# Patient Record
Sex: Male | Born: 1960 | Race: White | Hispanic: No | Marital: Married | Smoking: Never smoker
Health system: Southern US, Community
[De-identification: ages and names within clinical notes are randomized; demographics above are authoritative.]

## PROBLEM LIST (undated history)

## (undated) DIAGNOSIS — E538 Deficiency of other specified B group vitamins: Secondary | ICD-10-CM

## (undated) DIAGNOSIS — R209 Unspecified disturbances of skin sensation: Secondary | ICD-10-CM

## (undated) DIAGNOSIS — R001 Bradycardia, unspecified: Secondary | ICD-10-CM

## (undated) DIAGNOSIS — R55 Syncope and collapse: Secondary | ICD-10-CM

## (undated) DIAGNOSIS — H269 Unspecified cataract: Secondary | ICD-10-CM

## (undated) DIAGNOSIS — N529 Male erectile dysfunction, unspecified: Secondary | ICD-10-CM

## (undated) DIAGNOSIS — C801 Malignant (primary) neoplasm, unspecified: Secondary | ICD-10-CM

## (undated) DIAGNOSIS — L570 Actinic keratosis: Secondary | ICD-10-CM

## (undated) DIAGNOSIS — D369 Benign neoplasm, unspecified site: Secondary | ICD-10-CM

## (undated) HISTORY — DX: Male erectile dysfunction, unspecified: N52.9

## (undated) HISTORY — DX: Unspecified disturbances of skin sensation: R20.9

## (undated) HISTORY — DX: Unspecified cataract: H26.9

## (undated) HISTORY — DX: Deficiency of other specified B group vitamins: E53.8

## (undated) HISTORY — PX: NECK SURGERY: SHX720

## (undated) HISTORY — PX: POLYPECTOMY: SHX149

## (undated) HISTORY — DX: Actinic keratosis: L57.0

## (undated) HISTORY — DX: Malignant (primary) neoplasm, unspecified: C80.1

## (undated) HISTORY — PX: COLONOSCOPY: SHX174

## (undated) HISTORY — DX: Syncope and collapse: R55

## (undated) HISTORY — DX: Bradycardia, unspecified: R00.1

## (undated) HISTORY — DX: Benign neoplasm, unspecified site: D36.9

---

## 1999-04-02 HISTORY — PX: ROTATOR CUFF REPAIR: SHX139

## 2003-03-24 ENCOUNTER — Ambulatory Visit (HOSPITAL_COMMUNITY): Admission: RE | Admit: 2003-03-24 | Discharge: 2003-03-24 | Payer: Self-pay | Admitting: Orthopedic Surgery

## 2003-03-24 ENCOUNTER — Ambulatory Visit (HOSPITAL_BASED_OUTPATIENT_CLINIC_OR_DEPARTMENT_OTHER): Admission: RE | Admit: 2003-03-24 | Discharge: 2003-03-24 | Payer: Self-pay | Admitting: Orthopedic Surgery

## 2009-04-19 ENCOUNTER — Ambulatory Visit: Payer: Self-pay | Admitting: Family Medicine

## 2009-04-19 DIAGNOSIS — N529 Male erectile dysfunction, unspecified: Secondary | ICD-10-CM | POA: Insufficient documentation

## 2009-04-19 DIAGNOSIS — L570 Actinic keratosis: Secondary | ICD-10-CM | POA: Insufficient documentation

## 2009-04-19 HISTORY — DX: Actinic keratosis: L57.0

## 2009-04-19 HISTORY — DX: Male erectile dysfunction, unspecified: N52.9

## 2009-04-21 LAB — CONVERTED CEMR LAB
ALT: 31 units/L (ref 0–53)
AST: 29 units/L (ref 0–37)
Alkaline Phosphatase: 79 units/L (ref 39–117)
Basophils Relative: 0 % (ref 0.0–3.0)
Bilirubin, Direct: 0.1 mg/dL (ref 0.0–0.3)
Calcium: 9.5 mg/dL (ref 8.4–10.5)
Cholesterol: 210 mg/dL — ABNORMAL HIGH (ref 0–200)
Creatinine, Ser: 1 mg/dL (ref 0.4–1.5)
Eosinophils Relative: 3.1 % (ref 0.0–5.0)
Hemoglobin: 14.7 g/dL (ref 13.0–17.0)
Lymphocytes Relative: 25.4 % (ref 12.0–46.0)
Monocytes Relative: 6.6 % (ref 3.0–12.0)
Neutro Abs: 4.4 10*3/uL (ref 1.4–7.7)
Neutrophils Relative %: 64.9 % (ref 43.0–77.0)
RBC: 4.98 M/uL (ref 4.22–5.81)
Sodium: 141 meq/L (ref 135–145)
Total CHOL/HDL Ratio: 4
Total Protein: 8 g/dL (ref 6.0–8.3)
Triglycerides: 62 mg/dL (ref 0.0–149.0)
VLDL: 12.4 mg/dL (ref 0.0–40.0)
WBC: 6.7 10*3/uL (ref 4.5–10.5)

## 2009-09-08 ENCOUNTER — Ambulatory Visit: Payer: Self-pay | Admitting: Family Medicine

## 2009-09-08 DIAGNOSIS — R209 Unspecified disturbances of skin sensation: Secondary | ICD-10-CM

## 2009-09-08 HISTORY — DX: Unspecified disturbances of skin sensation: R20.9

## 2010-05-01 NOTE — Assessment & Plan Note (Signed)
Summary: periodic numbness and tingling all over body/cjr   Vital Signs:  Patient profile:   50 year old male Weight:      206 pounds Temp:     98.6 degrees F oral BP sitting:   108 / 68  (left arm) Cuff size:   regular  Vitals Entered By: Sid Falcon LPN (September 08, 2009 2:38 PM) CC: abdominal pain Comments CBG 108   History of Present Illness: patient seen with episode 2 days ago after getting up from the bathroom of tingling over his body. Upper and lower extremities right and left side. Symptoms last about 5 minutes. No hyperventilation. Denies any confusion, headaches, chest pain, or syncope. No focal weakness. No speech changes. No facial asymmetry. No visual changes.  No recurrent symptoms since then.  No prior episode of similar symptoms.  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    Physical Exam  General:  Well-developed,well-nourished,in no acute distress; alert,appropriate and cooperative throughout examination Head:  Normocephalic and atraumatic without obvious abnormalities. No apparent alopecia or balding. Eyes:  No corneal or conjunctival inflammation noted. EOMI. Perrla. Funduscopic exam benign,  without hemorrhages, exudates or papilledema. Vision grossly normal. Ears:  External ear exam shows no significant lesions or deformities.  Otoscopic examination reveals clear canals, tympanic membranes are intact bilaterally without bulging, retraction, inflammation or discharge. Hearing is grossly normal bilaterally. Mouth:  Oral mucosa and oropharynx without lesions or exudates.  Teeth in good repair. Neck:  No deformities, masses, or tenderness noted. Lungs:  Normal respiratory effort, chest expands symmetrically. Lungs are clear to auscultation, no crackles or wheezes. Heart:  Normal rate and regular rhythm. S1 and S2 normal without gallop, murmur, click, rub or other extra sounds. Extremities:  No clubbing, cyanosis, edema, or deformity noted with normal full range of motion of all joints.   Neurologic:  No cranial nerve deficits noted. Station and gait are normal. Plantar reflexes are down-going bilaterally. DTRs are symmetrical throughout. Sensory, motor and coordinative functions appear intact.   Impression & Recommendations:  Problem # 1:  DYSESTHESIA (ICD-782.0) Assessment New question vasovagal type reaction. Nonfocal neuro exam at this point. Recommend observation.  Pt instructed to be in touch if any recurrent symptoms.  Complete Medication List: 1)  Cialis 20 Mg Tabs (Tadalafil) .... One by mouth every other day as needed  Patient Instructions: 1)  follow up promptly if you notice any focal weakness, recurrent numbness or tingling, confusion, or any progressive headaches 2)  ? vasovagal reaction

## 2010-05-01 NOTE — Assessment & Plan Note (Signed)
Summary: new to est-requesting cpx-will fast-ccm   Vital Signs:  Patient profile:   50 year old male Height:      72 inches Weight:      198 pounds BMI:     26.95 Temp:     98.2 degrees F oral Pulse rate:   80 / minute Pulse rhythm:   regular Resp:     12 per minute BP sitting:   120 / 92  (left arm) Cuff size:   regular  Vitals Entered By: Sid Falcon LPN (04-29-09 9:04 AM)  Nutrition Counseling: Patient's BMI is greater than 25 and therefore counseled on weight management options. CC: New to establish, fasting for labs   History of Present Illness: Patient seen to establish care and for wellness visit.  Past medical history reviewed. No chronic problems. Remote history right rotator cuff surgery several years ago. No medications. No known allergies. Family history significant for father dying of some sort of cardiomyopathy at 34. No known ischemic disease.  Patient currently undergoing divorce. Increased stress with job and 50 year old with learning disability. Nonsmoker. Rare alcohol use. Currently not exercising any. Last tetanus less than 5 years ago.  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 Alcohol use-yes Regular exercise-yes Son learning disability  Risk Factors: Exercise: yes (04-29-2009)  Risk Factors: Smoking Status: never (04/29/2009)  Past Medical History: No chronic medical problem  Past Surgical History: R rotator cuff 2001  Family History: Heart disease, father dies age 21  Cardiomyopathy, nonischemic  Social History: Occupation:   Public librarian Divorced Never Smoked Alcohol use-yes Regular exercise-yes Son learning disability Occupation:  employed Smoking Status:  never Does Patient Exercise:  yes  Review of Systems  The patient denies anorexia, fever, weight loss, weight gain, vision loss, decreased hearing, hoarseness, chest pain, syncope, dyspnea on exertion, peripheral edema, prolonged cough, headaches, hemoptysis, abdominal pain, melena, hematochezia, severe indigestion/heartburn, hematuria, incontinence, genital sores, muscle weakness, suspicious skin lesions, transient blindness, difficulty walking, depression, unusual weight change, abnormal bleeding, enlarged lymph nodes, and testicular masses.         scaly skin lesion nonpainful left ear.  occasional problems with erectile dysfunction. Occasional problems with sleep.  Physical Exam  General:  Well-developed,well-nourished,in no acute distress; alert,appropriate and cooperative throughout examination Head:  Normocephalic and atraumatic without obvious abnormalities. No apparent alopecia or balding. Eyes:  No corneal or conjunctival inflammation noted. EOMI. Perrla. Funduscopic exam benign, without hemorrhages, exudates or papilledema. Vision grossly normal. Ears:  External ear exam shows no significant lesions or deformities.  Otoscopic examination reveals clear canals, tympanic membranes are intact bilaterally without bulging, retraction, inflammation or discharge. Hearing is grossly normal bilaterally. Nose:  External nasal examination shows no deformity or inflammation. Nasal mucosa are pink and moist without lesions or exudates. Mouth:  Oral mucosa and oropharynx without lesions or exudates.  Teeth in good repair. Neck:  No deformities, masses, or tenderness noted. Chest Wall:  No deformities, masses, tenderness or gynecomastia noted. Lungs:  Normal respiratory effort, chest expands symmetrically. Lungs are clear to auscultation, no crackles or  wheezes. Heart:  Normal rate and regular rhythm. S1 and S2 normal without gallop, murmur, click, rub or other extra sounds.  Abdomen:  Bowel sounds positive,abdomen soft and non-tender without masses, organomegaly or hernias noted. Extremities:  No clubbing, cyanosis, edema, or deformity noted with normal full range of motion of all joints.   Neurologic:  alert & oriented X3, cranial nerves II-XII intact, strength normal in all extremities, and gait normal.   Skin:  left outer ear reveals small area approximately 2 x 3 mm slightly scaly with slightly erythematous base. No nodular changes. No ulcerative changes. Cervical Nodes:  No lymphadenopathy noted Psych:  Cognition and judgment appear intact. Alert and cooperative with normal attention span and concentration. No apparent delusions, illusions, hallucinations   Impression & Recommendations:  Problem # 1:  Preventive Health Care (ICD-V70.0) Screening labs.  discussed starting regular exercise and other measures for stress reduction.  Problem # 2:  ACTINIC KERATOSIS (ICD-702.0) discussed risks and benefits of cryotherapy and pt wished to proceed.  Treated without difficulty.  Pt instructed to have this reassessed if not improving/resolving in one month. Orders: Cryotherapy/Destruction benign or premalignant lesion (1st lesion)  (17000)  Problem # 3:  IMPOTENCE OF ORGANIC ORIGIN (ICD-607.84) Rx for Cialis which he has taken in past. His updated medication list for this problem includes:    Cialis 20 Mg Tabs (Tadalafil) ..... One by mouth every other day as needed  Complete Medication List: 1)  Cialis 20 Mg Tabs (Tadalafil) .... One by mouth every other day as needed  Other Orders: Venipuncture (10272) TLB-Lipid Panel (80061-LIPID) TLB-BMP (Basic Metabolic Panel-BMET) (80048-METABOL) TLB-CBC Platelet - w/Differential (85025-CBCD) TLB-Hepatic/Liver Function Pnl (80076-HEPATIC) TLB-TSH (Thyroid Stimulating Hormone)  (84443-TSH)  Patient Instructions: 1)  It is important that you exercise reguarly at least 20 minutes 5 times a week. If you develop chest pain, have severe difficulty breathing, or feel very tired, stop exercising immediately and seek medical attention.  Prescriptions: CIALIS 20 MG TABS (TADALAFIL) one by mouth every other day as needed  #6 x 11   Entered and Authorized by:   Evelena Peat MD   Signed by:   Evelena Peat MD on 04/19/2009   Method used:   Electronically to        [facility]  [phone]* (retail)       [address] [place], [place]  [address]       Ph: [phone] or [phone]       Fax: [phone]   RxID:   [id number]

## 2010-05-21 ENCOUNTER — Encounter: Payer: Self-pay | Admitting: Family Medicine

## 2010-05-21 ENCOUNTER — Ambulatory Visit (INDEPENDENT_AMBULATORY_CARE_PROVIDER_SITE_OTHER)
Admission: RE | Admit: 2010-05-21 | Discharge: 2010-05-21 | Disposition: A | Discharge status: Home / Self Care | Payer: PRIVATE HEALTH INSURANCE | Source: Ambulatory Visit | Attending: Family Medicine | Admitting: Family Medicine

## 2010-05-21 ENCOUNTER — Ambulatory Visit (INDEPENDENT_AMBULATORY_CARE_PROVIDER_SITE_OTHER): Payer: PRIVATE HEALTH INSURANCE | Admitting: Family Medicine

## 2010-05-21 VITALS — BP 110/84 | Temp 98.1°F | Ht 72.5 in | Wt 198.0 lb

## 2010-05-21 DIAGNOSIS — R109 Unspecified abdominal pain: Secondary | ICD-10-CM

## 2010-05-21 LAB — POCT URINALYSIS DIPSTICK
Bilirubin, UA: NEGATIVE
Blood, UA: NEGATIVE
Ketones, UA: NEGATIVE
Nitrite, UA: NEGATIVE
Spec Grav, UA: 1.025
pH, UA: 5

## 2010-05-21 MED ORDER — HYDROCODONE-ACETAMINOPHEN 5-325 MG PO TABS: 2.0000 | ORAL_TABLET | Freq: Four times a day (QID) | ORAL | Status: AC | PRN

## 2010-05-21 NOTE — Progress Notes (Signed)
  Subjective:    Patient ID: Paul Bird, male    DOB: 01/31/1961, 50 y.o.   MRN: [id number]  HPI  Patient is seen with one week history of progressive pain right flank area. States sudden onset. Progressive in intensity and currently about 9/10 in severity. Sharp at times. Occasional slight radiation anteriorly. No history of kidney stones. He denies any dysuria, fever, chills, gross hematuria. No exacerbating features. Took some Aleve without any relief. No real abdominal pain. No skin rashes.  No history of injury.   Review of Systems  Constitutional: Negative for fever, chills, diaphoresis, activity change, appetite change and unexpected weight change.  Respiratory: Negative for cough and shortness of breath.   Cardiovascular: Negative for chest pain, palpitations and leg swelling.  Gastrointestinal: Negative for nausea, vomiting, abdominal pain, diarrhea, constipation, blood in stool and abdominal distention.  Genitourinary: Positive for flank pain. Negative for dysuria, frequency, hematuria, decreased urine volume, enuresis, difficulty urinating and testicular pain.  Musculoskeletal: Positive for back pain. Negative for myalgias and joint swelling.  Skin: Negative for rash.       Objective:   Physical Exam  patient is alert and in no acute distress.  neck no masses Chest clear to auscultation Heart regular rhythm and rate with no murmur Abdomen soft nontender with no masses palpated Back exam no visible rash. No ecchymosis or any visible swelling or warmth. Tender right flank region  Skin exam no rash  Extremities no edema       Assessment & Plan:   right flank pain. Rule out kidney stone versus musculoskeletal. Urine dipstick reveals no hematuria which makes stones somewhat less likely Check KUB scout.  Consider CT abd/pelvis if pain persists or worsens.  Hydrocodone for pain relief.

## 2010-08-17 NOTE — Op Note (Signed)
Paul Bird, Paul Bird                           ACCOUNT NO.:  [id number]   MEDICAL RECORD NO.:  [id number]                   PATIENT TYPE:  AMB   LOCATION:  [facility]                                  FACILITY:  [facility]   PHYSICIAN:  Loreta Ave, M.D.              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 Labral tear.   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 Labral tear.   PROCEDURE:  Right shoulder exam under anesthesia, arthroscopy, debridement  of labrum and rotator cuff. Acromioplasty with CA ligament release. Excision  of distal clavicle.   SURGEON:  Loreta Ave, M.D.   ASSISTANT:  Arlys John D. Petrarca, P.A.-C.   ANESTHESIA:  General.   ESTIMATED BLOOD LOSS:  Minimal.   SPECIMENS:  None.   CULTURES:  None.   COMPLICATIONS:  None.   DRESSING:  Soft compressive with sling.   DESCRIPTION OF PROCEDURE:  The patient was brought to the operating room and  placed on the operating table in the supine position. After adequate  anesthesia had been obtained the right shoulder was examined. It had full  motion and good stability. The patient was placed in the beachchair position  on a shoulder positioner and prepped and draped in the usual sterile  fashion.   Three standard portals, anterior and posterolateral. The shoulder was  entered with a blunt obturator and distended and inspected. The glenohumeral  joint had intact cartilage. Circumferential complex tearing, labrum all  debrided to a stable surface. The biceps tendon was intact. A little  mobility at the anchor, but still intact. The undersurface of the cuff had  some roughening but no significant structural tears. The  capsule and  ligamentous structures were intact.   The cannula was redirected subacromially. Markedly thickened bursa with  abrasive change  over the top of the cuff with a type 2 acromion and marked  spurring at the acromioclavicular joint. The bursa was resected, the cuff  debrided and acromioplasty to a type 1 acromion with a shaver and a high-  speed bur, releasing the CA ligament with cautery.   Distal clavicle with grade 4 changes with marked osteolysis and spurs. All  spurs, loose fragments and remnants of cartilage removed. Resection of a  lateral centimeter of clavicle with shaver and a high-speed bur. Adequacy of  decompression and clavicular excision confirmed viewing from all portals.   The instruments and fluid were removed. The portals, shoulder and bursa were  injected with Marcaine. The portal was closed with 4-0 nylon. A sterile  compressive dressing  was applied. Anesthesia was reversed.   The patient was brought to the recovery room. He  tolerated  the surgery well  without complications.                                               Loreta Ave, M.D.    DFM/MEDQ  D:  03/24/2003  T:  03/26/2003  Job:  [id number]

## 2011-03-22 ENCOUNTER — Other Ambulatory Visit: Payer: Self-pay | Admitting: *Deleted

## 2011-03-22 MED ORDER — TADALAFIL 20 MG PO TABS: 10.0000 mg | ORAL_TABLET | ORAL | Status: DC | PRN

## 2011-07-30 ENCOUNTER — Other Ambulatory Visit: Payer: Self-pay | Admitting: *Deleted

## 2011-07-30 MED ORDER — TADALAFIL 20 MG PO TABS: 10.0000 mg | ORAL_TABLET | ORAL | Status: DC | PRN

## 2011-08-06 ENCOUNTER — Ambulatory Visit: Payer: PRIVATE HEALTH INSURANCE | Admitting: Family Medicine

## 2011-08-06 ENCOUNTER — Other Ambulatory Visit (INDEPENDENT_AMBULATORY_CARE_PROVIDER_SITE_OTHER): Payer: 59

## 2011-08-06 DIAGNOSIS — Z0000 Encounter for general adult medical examination without abnormal findings: Secondary | ICD-10-CM

## 2011-08-06 LAB — CBC WITH DIFFERENTIAL/PLATELET
Basophils Absolute: 0 10*3/uL (ref 0.0–0.1)
Eosinophils Relative: 2.4 % (ref 0.0–5.0)
Hemoglobin: 14.6 g/dL (ref 13.0–17.0)
Lymphocytes Relative: 24.3 % (ref 12.0–46.0)
Lymphs Abs: 2 10*3/uL (ref 0.7–4.0)
MCHC: 33.4 g/dL (ref 30.0–36.0)
MCV: 90.5 fl (ref 78.0–100.0)
Monocytes Relative: 7.8 % (ref 3.0–12.0)
Neutrophils Relative %: 65.1 % (ref 43.0–77.0)
RBC: 4.82 Mil/uL (ref 4.22–5.81)

## 2011-08-06 LAB — BASIC METABOLIC PANEL
CO2: 28 mEq/L (ref 19–32)
Chloride: 106 mEq/L (ref 96–112)
Creatinine, Ser: 1.1 mg/dL (ref 0.4–1.5)
Glucose, Bld: 80 mg/dL (ref 70–99)

## 2011-08-06 LAB — HEPATIC FUNCTION PANEL
ALT: 24 U/L (ref 0–53)
AST: 30 U/L (ref 0–37)
Alkaline Phosphatase: 73 U/L (ref 39–117)
Bilirubin, Direct: 0.1 mg/dL (ref 0.0–0.3)
Total Bilirubin: 0.7 mg/dL (ref 0.3–1.2)

## 2011-08-06 LAB — POCT URINALYSIS DIPSTICK
Glucose, UA: NEGATIVE
Leukocytes, UA: NEGATIVE
Urobilinogen, UA: 0.2

## 2011-08-06 LAB — LIPID PANEL: Cholesterol: 185 mg/dL (ref 0–200)

## 2011-08-12 ENCOUNTER — Encounter: Payer: Self-pay | Admitting: Family Medicine

## 2011-08-12 ENCOUNTER — Ambulatory Visit (INDEPENDENT_AMBULATORY_CARE_PROVIDER_SITE_OTHER): Payer: 59 | Admitting: Family Medicine

## 2011-08-12 VITALS — BP 142/82 | HR 80 | Temp 97.9°F | Resp 12 | Ht 72.5 in | Wt 200.0 lb

## 2011-08-12 DIAGNOSIS — N400 Enlarged prostate without lower urinary tract symptoms: Secondary | ICD-10-CM

## 2011-08-12 DIAGNOSIS — Z0000 Encounter for general adult medical examination without abnormal findings: Secondary | ICD-10-CM

## 2011-08-12 MED ORDER — TADALAFIL 20 MG PO TABS: 20.0000 mg | ORAL_TABLET | ORAL | Status: DC | PRN

## 2011-08-12 NOTE — Progress Notes (Signed)
  Subjective:    Patient ID: Paul Bird, male    DOB: January 08, 1961, 51 y.o.   MRN: [id number]  HPI  Complete physical. Patient is generally very healthy. Takes only Cialis as needed for erectile dysfunction but no other medications. Exercises regularly several days per week. Nonsmoker.  Turned 51 this year. No history of screening colonoscopy. Had tetanus last October.  Family history, past medical history, and social history reviewed as indicated below:  Past Medical History  Diagnosis Date  . Erectile dysfunction   . Actinic keratosis 04/19/2009    Qualifier: Diagnosis of  By: Rita Ohara    . 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 DYSESTHESIA 09/08/2009    Qualifier: Diagnosis of  By: Caryl Never MD, Oliviya Gilkison     Past Surgical History  Procedure Date  . Rotator cuff repair     reports that he has never smoked. He does not have any smokeless tobacco history on file. His alcohol and drug histories not on file. family history includes Heart disease (age of onset:57) in his father. No Known Allergies    Review of Systems  Constitutional: Negative for fever, activity change, appetite change and fatigue.  HENT: Negative for ear pain, congestion and trouble swallowing.   Eyes: Negative for pain and visual disturbance.  Respiratory: Negative for cough, shortness of breath and wheezing.   Cardiovascular: Negative for chest pain and palpitations.  Gastrointestinal: Negative for nausea, vomiting, abdominal pain, diarrhea, constipation, blood in stool, abdominal distention and rectal pain.  Genitourinary: Negative for dysuria, hematuria and testicular pain.  Musculoskeletal: Negative for joint swelling and arthralgias.  Skin: Negative for rash.  Neurological: Negative for dizziness, syncope and headaches.  Hematological: Negative for adenopathy.  Psychiatric/Behavioral: Negative for confusion and dysphoric mood.       Objective:     Physical Exam  Constitutional: He is oriented to person, place, and time. He appears well-developed and well-nourished. No distress.  HENT:  Head: Normocephalic and atraumatic.  Right Ear: External ear normal.  Left Ear: External ear normal.  Mouth/Throat: Oropharynx is clear and moist.  Eyes: Conjunctivae and EOM are normal. Pupils are equal, round, and reactive to light.  Neck: Normal range of motion. Neck supple. No thyromegaly present.  Cardiovascular: Normal rate, regular rhythm and normal heart sounds.   No murmur heard. Pulmonary/Chest: No respiratory distress. He has no wheezes. He has no rales.  Abdominal: Soft. Bowel sounds are normal. He exhibits no distension and no mass. There is no tenderness. There is no rebound and no guarding.  Genitourinary: Rectum normal and prostate normal.  Musculoskeletal: He exhibits no edema.  Lymphadenopathy:    He has no cervical adenopathy.  Neurological: He is alert and oriented to person, place, and time. He displays normal reflexes. No cranial nerve deficit.  Skin: No rash noted.  Psychiatric: He has a normal mood and affect. His behavior is normal.          Assessment & Plan:  Complete physical. Labs reviewed with patient. All basically favorable with the exception of high normal PSA for age. No asymmetry with prostate exam. Repeat PSA in 6 months. Schedule screening colonoscopy. Refill Cialis for one year

## 2011-08-12 NOTE — Patient Instructions (Signed)
We call you regarding colonoscopy screening

## 2011-10-18 ENCOUNTER — Encounter: Payer: Self-pay | Admitting: Gastroenterology

## 2012-01-31 ENCOUNTER — Other Ambulatory Visit: Payer: 59

## 2012-02-10 ENCOUNTER — Other Ambulatory Visit: Payer: 59

## 2012-02-12 ENCOUNTER — Other Ambulatory Visit: Payer: 59

## 2012-04-09 ENCOUNTER — Other Ambulatory Visit (INDEPENDENT_AMBULATORY_CARE_PROVIDER_SITE_OTHER): Payer: Commercial Managed Care - PPO

## 2012-04-09 DIAGNOSIS — N400 Enlarged prostate without lower urinary tract symptoms: Secondary | ICD-10-CM

## 2012-04-09 DIAGNOSIS — Z0000 Encounter for general adult medical examination without abnormal findings: Secondary | ICD-10-CM

## 2012-04-09 LAB — BASIC METABOLIC PANEL
CO2: 31 mEq/L (ref 19–32)
Glucose, Bld: 95 mg/dL (ref 70–99)
Potassium: 4 mEq/L (ref 3.5–5.1)
Sodium: 137 mEq/L (ref 135–145)

## 2012-04-09 LAB — CBC WITH DIFFERENTIAL/PLATELET
Basophils Relative: 0.5 % (ref 0.0–3.0)
Eosinophils Relative: 4 % (ref 0.0–5.0)
HCT: 41.9 % (ref 39.0–52.0)
Hemoglobin: 14.2 g/dL (ref 13.0–17.0)
Lymphocytes Relative: 27.9 % (ref 12.0–46.0)
Monocytes Absolute: 0.6 10*3/uL (ref 0.1–1.0)
Monocytes Relative: 8.2 % (ref 3.0–12.0)
Neutrophils Relative %: 59.4 % (ref 43.0–77.0)
Platelets: 214 10*3/uL (ref 150.0–400.0)

## 2012-04-09 LAB — LIPID PANEL
LDL Cholesterol: 123 mg/dL — ABNORMAL HIGH (ref 0–99)
Total CHOL/HDL Ratio: 4
Triglycerides: 68 mg/dL (ref 0.0–149.0)

## 2012-04-09 LAB — HEPATIC FUNCTION PANEL
AST: 21 U/L (ref 0–37)
Albumin: 3.9 g/dL (ref 3.5–5.2)
Alkaline Phosphatase: 62 U/L (ref 39–117)
Total Protein: 7.4 g/dL (ref 6.0–8.3)

## 2012-04-09 LAB — POCT URINALYSIS DIPSTICK
Blood, UA: NEGATIVE
Glucose, UA: NEGATIVE
Spec Grav, UA: 1.025
Urobilinogen, UA: 0.2

## 2012-04-16 ENCOUNTER — Encounter: Payer: Self-pay | Admitting: Family Medicine

## 2012-04-16 ENCOUNTER — Ambulatory Visit (INDEPENDENT_AMBULATORY_CARE_PROVIDER_SITE_OTHER): Payer: Commercial Managed Care - PPO | Admitting: Family Medicine

## 2012-04-16 VITALS — BP 110/78 | HR 72 | Temp 98.9°F | Resp 12 | Ht 72.75 in | Wt 198.0 lb

## 2012-04-16 DIAGNOSIS — Z0000 Encounter for general adult medical examination without abnormal findings: Secondary | ICD-10-CM

## 2012-04-16 MED ORDER — SILDENAFIL CITRATE 100 MG PO TABS: 100.0000 mg | ORAL_TABLET | Freq: Every day | ORAL | Status: DC | PRN

## 2012-04-16 NOTE — Progress Notes (Signed)
  Subjective:    Patient ID: Paul Bird, male    DOB: 1960-11-24, 52 y.o.   MRN: [id number]  HPI Patient here for complete physical. We set up colonoscopy last year been never went. He had problems with scheduling. Very healthy. History of erectile dysfunction. Has taken Cialis and requesting Viagra. No prior side effects. Tetanus up-to-date. Declines flu vaccine. Exercising regularly. No chest pains or dizziness. Nonsmoker  Father had some type of heart issue at age 54 but this sounds like more of a myocarditis. No history of CAD  Past Medical History  Diagnosis Date  . Erectile dysfunction   . Actinic keratosis 04/19/2009    Qualifier: Diagnosis of  By: Rita Ohara    . 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 DYSESTHESIA 09/08/2009    Qualifier: Diagnosis of  By: Caryl Never MD, Samyria Rudie     Past Surgical History  Procedure Date  . Rotator cuff repair     reports that he has never smoked. He does not have any smokeless tobacco history on file. His alcohol and drug histories not on file. family history includes Heart disease (age of onset:57) in his father. No Known Allergies    Review of Systems  Constitutional: Negative for fever, activity change, appetite change and fatigue.  HENT: Negative for ear pain, congestion and trouble swallowing.   Eyes: Negative for pain and visual disturbance.  Respiratory: Negative for cough, shortness of breath and wheezing.   Cardiovascular: Negative for chest pain and palpitations.  Gastrointestinal: Negative for nausea, vomiting, abdominal pain, diarrhea, constipation, blood in stool, abdominal distention and rectal pain.  Genitourinary: Negative for dysuria, hematuria and testicular pain.  Musculoskeletal: Negative for joint swelling and arthralgias.  Skin: Negative for rash.  Neurological: Negative for dizziness, syncope and headaches.  Hematological: Negative for adenopathy.    Psychiatric/Behavioral: Negative for confusion and dysphoric mood.       Objective:   Physical Exam  Constitutional: He is oriented to person, place, and time. He appears well-developed and well-nourished. No distress.  HENT:  Head: Normocephalic and atraumatic.  Right Ear: External ear normal.  Left Ear: External ear normal.  Mouth/Throat: Oropharynx is clear and moist.  Eyes: Conjunctivae normal and EOM are normal. Pupils are equal, round, and reactive to light.  Neck: Normal range of motion. Neck supple. No thyromegaly present.  Cardiovascular: Normal rate, regular rhythm and normal heart sounds.   No murmur heard. Pulmonary/Chest: No respiratory distress. He has no wheezes. He has no rales.  Abdominal: Soft. Bowel sounds are normal. He exhibits no distension and no mass. There is no tenderness. There is no rebound and no guarding.  Musculoskeletal: He exhibits no edema.  Lymphadenopathy:    He has no cervical adenopathy.  Neurological: He is alert and oriented to person, place, and time. He displays normal reflexes. No cranial nerve deficit.  Skin: No rash noted.  Psychiatric: He has a normal mood and affect.          Assessment & Plan:  Healthy 52 year old male. Labs reviewed. Reschedule colonoscopy. Viagra 100 mg one half to one tablet daily as needed

## 2013-04-30 ENCOUNTER — Other Ambulatory Visit: Payer: Self-pay | Admitting: Family Medicine

## 2013-10-04 ENCOUNTER — Other Ambulatory Visit (INDEPENDENT_AMBULATORY_CARE_PROVIDER_SITE_OTHER): Payer: Commercial Managed Care - PPO

## 2013-10-04 DIAGNOSIS — Z0000 Encounter for general adult medical examination without abnormal findings: Secondary | ICD-10-CM

## 2013-10-04 LAB — POCT URINALYSIS DIPSTICK
Bilirubin, UA: NEGATIVE
Blood, UA: NEGATIVE
Glucose, UA: NEGATIVE
Ketones, UA: NEGATIVE
Leukocytes, UA: NEGATIVE
Nitrite, UA: NEGATIVE
PH UA: 8.5
Spec Grav, UA: 1.015
Urobilinogen, UA: 1

## 2013-10-04 LAB — LIPID PANEL
Cholesterol: 218 mg/dL — ABNORMAL HIGH (ref 0–200)
HDL: 53.1 mg/dL (ref >39.00)
LDL CALC: 149 mg/dL — AB (ref 0–99)
NonHDL: 164.9
Total CHOL/HDL Ratio: 4
Triglycerides: 80 mg/dL (ref 0.0–149.0)
VLDL: 16 mg/dL (ref 0.0–40.0)

## 2013-10-04 LAB — CBC WITH DIFFERENTIAL/PLATELET
BASOS ABS: 0 10*3/uL (ref 0.0–0.1)
Basophils Relative: 0.4 % (ref 0.0–3.0)
EOS ABS: 0.4 10*3/uL (ref 0.0–0.7)
Eosinophils Relative: 5.3 % — ABNORMAL HIGH (ref 0.0–5.0)
HEMATOCRIT: 42.3 % (ref 39.0–52.0)
HEMOGLOBIN: 14.3 g/dL (ref 13.0–17.0)
LYMPHS ABS: 1.7 10*3/uL (ref 0.7–4.0)
Lymphocytes Relative: 22.9 % (ref 12.0–46.0)
MCHC: 33.7 g/dL (ref 30.0–36.0)
MCV: 88.6 fl (ref 78.0–100.0)
MONO ABS: 0.6 10*3/uL (ref 0.1–1.0)
Monocytes Relative: 7.6 % (ref 3.0–12.0)
NEUTROS ABS: 4.7 10*3/uL (ref 1.4–7.7)
Neutrophils Relative %: 63.8 % (ref 43.0–77.0)
PLATELETS: 240 10*3/uL (ref 150.0–400.0)
RBC: 4.78 Mil/uL (ref 4.22–5.81)
RDW: 13.3 % (ref 11.5–15.5)
WBC: 7.3 10*3/uL (ref 4.0–10.5)

## 2013-10-04 LAB — HEPATIC FUNCTION PANEL
ALBUMIN: 4.1 g/dL (ref 3.5–5.2)
ALT: 29 U/L (ref 0–53)
AST: 28 U/L (ref 0–37)
Alkaline Phosphatase: 74 U/L (ref 39–117)
BILIRUBIN TOTAL: 0.9 mg/dL (ref 0.2–1.2)
Bilirubin, Direct: 0.1 mg/dL (ref 0.0–0.3)
Total Protein: 7.8 g/dL (ref 6.0–8.3)

## 2013-10-04 LAB — BASIC METABOLIC PANEL
BUN: 14 mg/dL (ref 6–23)
CO2: 28 meq/L (ref 19–32)
Calcium: 9.4 mg/dL (ref 8.4–10.5)
Chloride: 101 mEq/L (ref 96–112)
Creatinine, Ser: 1.1 mg/dL (ref 0.4–1.5)
GFR: 76.94 mL/min (ref >60.00)
GLUCOSE: 94 mg/dL (ref 70–99)
POTASSIUM: 4.2 meq/L (ref 3.5–5.1)
SODIUM: 138 meq/L (ref 135–145)

## 2013-10-04 LAB — PSA: PSA: 1.82 ng/mL (ref 0.10–4.00)

## 2013-10-04 LAB — TSH: TSH: 0.78 u[IU]/mL (ref 0.35–4.50)

## 2013-10-12 ENCOUNTER — Encounter: Payer: Self-pay | Admitting: Family Medicine

## 2013-10-12 ENCOUNTER — Ambulatory Visit (INDEPENDENT_AMBULATORY_CARE_PROVIDER_SITE_OTHER): Payer: Commercial Managed Care - PPO | Admitting: Family Medicine

## 2013-10-12 VITALS — BP 126/82 | HR 85 | Temp 97.6°F | Ht 72.0 in | Wt 200.0 lb

## 2013-10-12 DIAGNOSIS — F909 Attention-deficit hyperactivity disorder, unspecified type: Secondary | ICD-10-CM

## 2013-10-12 DIAGNOSIS — Z0000 Encounter for general adult medical examination without abnormal findings: Secondary | ICD-10-CM

## 2013-10-12 DIAGNOSIS — N528 Other male erectile dysfunction: Secondary | ICD-10-CM

## 2013-10-12 DIAGNOSIS — N529 Male erectile dysfunction, unspecified: Secondary | ICD-10-CM

## 2013-10-12 DIAGNOSIS — B351 Tinea unguium: Secondary | ICD-10-CM

## 2013-10-12 DIAGNOSIS — F988 Other specified behavioral and emotional disorders with onset usually occurring in childhood and adolescence: Secondary | ICD-10-CM

## 2013-10-12 HISTORY — DX: Attention-deficit hyperactivity disorder, unspecified type: F90.9

## 2013-10-12 MED ORDER — TADALAFIL 20 MG PO TABS
10.0000 mg | ORAL_TABLET | ORAL | Status: DC | PRN
Start: 2013-10-12 — End: 2014-08-12

## 2013-10-12 MED ORDER — LISDEXAMFETAMINE DIMESYLATE 50 MG PO CAPS: 50.0000 mg | ORAL_CAPSULE | Freq: Every day | ORAL | Status: DC

## 2013-10-12 MED ORDER — TERBINAFINE HCL 250 MG PO TABS: 250.0000 mg | ORAL_TABLET | Freq: Every day | ORAL | Status: DC

## 2013-10-12 NOTE — Progress Notes (Signed)
Subjective:    Patient ID: Paul Bird, male    DOB: 02-06-1961, 53 y.o.   MRN: [id number]  HPI Patient seen for complete physical and several other issues as below. Generally very healthy. He does not take any regular medications. Tetanus up-to-date. He has not had screening colonoscopy. Exercises fairly regularly.  Erectile dysfunction. Previously used Viagra but had headaches. Would like to explore other options. Good libido.  Onychomycotic changes mostly right foot involving the great toenail and also third and fourth digits. He has some thick and brittle changes.  treated previously with some kind of over-the-counter topical which did not work  Concern for attention deficit disorder. He has a new job which requires lots of focus. He has easy distractibility and difficulty completing tasks. Difficulty getting organized. Frequently misplaces or loses things. Has previously been on Vyvanse which worked well for him. Other than mild insomnia no side effects  Past Medical History  Diagnosis Date  . Erectile dysfunction   . Actinic keratosis 04/19/2009    Qualifier: Diagnosis of  By: Joyce Gross    . 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 DYSESTHESIA 09/08/2009    Qualifier: Diagnosis of  By: Elease Hashimoto MD, Johnnie Moten     Past Surgical History  Procedure Laterality Date  . Rotator cuff repair      reports that he has never smoked. He does not have any smokeless tobacco history on file. His alcohol and drug histories are not on file. family history includes Heart disease (age of onset: 38) in his father. No Known Allergies    Review of Systems  Constitutional: Negative for fever, activity change, appetite change and fatigue.  HENT: Negative for congestion, ear pain and trouble swallowing.   Eyes: Negative for pain and visual disturbance.  Respiratory: Negative for cough, shortness of breath and wheezing.   Cardiovascular:  Negative for chest pain and palpitations.  Gastrointestinal: Negative for nausea, vomiting, abdominal pain, diarrhea, constipation, blood in stool, abdominal distention and rectal pain.  Genitourinary: Negative for dysuria, hematuria and testicular pain.  Musculoskeletal: Negative for arthralgias and joint swelling.  Skin: Negative for rash.  Neurological: Negative for dizziness, syncope and headaches.  Hematological: Negative for adenopathy.  Psychiatric/Behavioral: Negative for confusion and dysphoric mood.       Objective:   Physical Exam  Constitutional: He is oriented to person, place, and time. He appears well-developed and well-nourished. No distress.  HENT:  Head: Normocephalic and atraumatic.  Right Ear: External ear normal.  Left Ear: External ear normal.  Mouth/Throat: Oropharynx is clear and moist.  Eyes: Conjunctivae and EOM are normal. Pupils are equal, round, and reactive to light.  Neck: Normal range of motion. Neck supple. No thyromegaly present.  Cardiovascular: Normal rate, regular rhythm and normal heart sounds.   No murmur heard. Pulmonary/Chest: No respiratory distress. He has no wheezes. He has no rales.  Abdominal: Soft. Bowel sounds are normal. He exhibits no distension and no mass. There is no tenderness. There is no rebound and no guarding.  Musculoskeletal: He exhibits no edema.  Lymphadenopathy:    He has no cervical adenopathy.  Neurological: He is alert and oriented to person, place, and time. He displays normal reflexes. No cranial nerve deficit.  Skin: No rash noted.  Right great toe and third and fourth digits reveals some dysmorphic nail changes with thickened nail  Psychiatric: He has a normal mood and affect.  Assessment & Plan:  #1 complete physical. Set up screening colonoscopy. Labs reviewed with patient with no major concerns #2 probable onychomycosis right foot. Discussed options. No hepatic concerns. Lamisil 250 mg once daily  for 3 months #3 erectile dysfunction. Cialis 20 mg one half to one tablet every other day as needed #4 attention deficit disorder- assessment done. He has assessment which strongly suggest attention deficit disorder and he has responded previously to medication. Start Vyvanse 50 mg once daily. Reviewed possible side effects. Touch base to reassess one to 2 months

## 2013-10-12 NOTE — Progress Notes (Signed)
Pre visit review using our clinic review tool, if applicable. No additional management support is needed unless otherwise documented below in the visit note. 

## 2013-12-15 ENCOUNTER — Encounter: Payer: Self-pay | Admitting: Family Medicine

## 2014-01-21 ENCOUNTER — Telehealth: Payer: Self-pay | Admitting: Family Medicine

## 2014-01-21 NOTE — Telephone Encounter (Signed)
Last visit 10/12/13 Last refill 10/12/13 #30 0 refill

## 2014-01-21 NOTE — Telephone Encounter (Signed)
Pt request refill of the following: lisdexamfetamine (VYVANSE) 50 MG capsule      Pt is requesting 70 mg     Phamacy:

## 2014-01-22 NOTE — Telephone Encounter (Signed)
Would generally increase in 10 mg increments.  He is currently on 50 mg.  Would go to Vyvanse 60 mg once daily - unless he has taken 70 mg dose in past.

## 2014-01-24 MED ORDER — LISDEXAMFETAMINE DIMESYLATE 60 MG PO CAPS: 60.0000 mg | ORAL_CAPSULE | Freq: Every day | ORAL | Status: DC

## 2014-01-24 NOTE — Telephone Encounter (Signed)
Pt is okay with taking 60mg . Pt aware that Rx is ready for pick up.

## 2014-06-08 ENCOUNTER — Telehealth: Payer: Self-pay | Admitting: Family Medicine

## 2014-06-08 NOTE — Telephone Encounter (Signed)
Refill OK

## 2014-06-08 NOTE — Telephone Encounter (Signed)
Last visit 10/12/13 Last refill 01/24/14 #30 0 refill

## 2014-06-08 NOTE — Telephone Encounter (Signed)
Pt request refill lisdexamfetamine (VYVANSE) 60 MG capsule.

## 2014-06-09 MED ORDER — LISDEXAMFETAMINE DIMESYLATE 60 MG PO CAPS: 60.0000 mg | ORAL_CAPSULE | Freq: Every day | ORAL | Status: DC

## 2014-06-09 NOTE — Telephone Encounter (Signed)
Pt aware that Rx is ready for pick up 

## 2014-08-09 ENCOUNTER — Other Ambulatory Visit (INDEPENDENT_AMBULATORY_CARE_PROVIDER_SITE_OTHER): Payer: Commercial Managed Care - PPO

## 2014-08-09 DIAGNOSIS — Z0000 Encounter for general adult medical examination without abnormal findings: Secondary | ICD-10-CM

## 2014-08-09 LAB — CBC WITH DIFFERENTIAL/PLATELET
BASOS PCT: 0.5 % (ref 0.0–3.0)
Basophils Absolute: 0 10*3/uL (ref 0.0–0.1)
Eosinophils Absolute: 0.3 10*3/uL (ref 0.0–0.7)
Eosinophils Relative: 3.5 % (ref 0.0–5.0)
HEMATOCRIT: 42.6 % (ref 39.0–52.0)
HEMOGLOBIN: 14.4 g/dL (ref 13.0–17.0)
LYMPHS PCT: 25.6 % (ref 12.0–46.0)
Lymphs Abs: 1.9 10*3/uL (ref 0.7–4.0)
MCHC: 33.8 g/dL (ref 30.0–36.0)
MCV: 87 fl (ref 78.0–100.0)
MONO ABS: 0.5 10*3/uL (ref 0.1–1.0)
Monocytes Relative: 6.9 % (ref 3.0–12.0)
NEUTROS ABS: 4.7 10*3/uL (ref 1.4–7.7)
Neutrophils Relative %: 63.5 % (ref 43.0–77.0)
Platelets: 239 10*3/uL (ref 150.0–400.0)
RBC: 4.89 Mil/uL (ref 4.22–5.81)
RDW: 13.5 % (ref 11.5–15.5)
WBC: 7.4 10*3/uL (ref 4.0–10.5)

## 2014-08-09 LAB — LIPID PANEL
Cholesterol: 207 mg/dL — ABNORMAL HIGH (ref 0–200)
HDL: 53 mg/dL (ref >39.00)
LDL Cholesterol: 139 mg/dL — ABNORMAL HIGH (ref 0–99)
NONHDL: 154
TRIGLYCERIDES: 76 mg/dL (ref 0.0–149.0)
Total CHOL/HDL Ratio: 4
VLDL: 15.2 mg/dL (ref 0.0–40.0)

## 2014-08-09 LAB — COMPREHENSIVE METABOLIC PANEL
ALT: 18 U/L (ref 0–53)
AST: 17 U/L (ref 0–37)
Albumin: 4.1 g/dL (ref 3.5–5.2)
Alkaline Phosphatase: 75 U/L (ref 39–117)
BUN: 15 mg/dL (ref 6–23)
CALCIUM: 9.5 mg/dL (ref 8.4–10.5)
CHLORIDE: 103 meq/L (ref 96–112)
CO2: 32 meq/L (ref 19–32)
Creatinine, Ser: 0.96 mg/dL (ref 0.40–1.50)
GFR: 86.92 mL/min (ref >60.00)
GLUCOSE: 92 mg/dL (ref 70–99)
POTASSIUM: 4.4 meq/L (ref 3.5–5.1)
SODIUM: 139 meq/L (ref 135–145)
TOTAL PROTEIN: 7.2 g/dL (ref 6.0–8.3)
Total Bilirubin: 0.5 mg/dL (ref 0.2–1.2)

## 2014-08-09 LAB — PSA: PSA: 1.47 ng/mL (ref 0.10–4.00)

## 2014-08-09 LAB — TSH: TSH: 1.28 u[IU]/mL (ref 0.35–4.50)

## 2014-08-09 NOTE — Addendum Note (Signed)
Addended by: Joyce Gross R on: 08/09/2014 11:52 AM   Modules accepted: Orders

## 2014-08-12 ENCOUNTER — Encounter: Payer: Self-pay | Admitting: Podiatry

## 2014-08-12 ENCOUNTER — Ambulatory Visit (INDEPENDENT_AMBULATORY_CARE_PROVIDER_SITE_OTHER): Payer: Commercial Managed Care - PPO | Admitting: Family Medicine

## 2014-08-12 ENCOUNTER — Encounter: Payer: Self-pay | Admitting: Family Medicine

## 2014-08-12 ENCOUNTER — Ambulatory Visit (INDEPENDENT_AMBULATORY_CARE_PROVIDER_SITE_OTHER): Payer: Commercial Managed Care - PPO | Admitting: Podiatry

## 2014-08-12 VITALS — BP 130/70 | HR 74 | Temp 98.3°F | Ht 72.44 in | Wt 201.0 lb

## 2014-08-12 VITALS — BP 136/84 | HR 63 | Ht 72.04 in | Wt 201.0 lb

## 2014-08-12 DIAGNOSIS — R200 Anesthesia of skin: Secondary | ICD-10-CM

## 2014-08-12 DIAGNOSIS — L600 Ingrowing nail: Secondary | ICD-10-CM | POA: Diagnosis not present

## 2014-08-12 DIAGNOSIS — L03011 Cellulitis of right finger: Secondary | ICD-10-CM

## 2014-08-12 DIAGNOSIS — Z0000 Encounter for general adult medical examination without abnormal findings: Secondary | ICD-10-CM | POA: Diagnosis not present

## 2014-08-12 DIAGNOSIS — R208 Other disturbances of skin sensation: Secondary | ICD-10-CM

## 2014-08-12 DIAGNOSIS — IMO0002 Reserved for concepts with insufficient information to code with codable children: Secondary | ICD-10-CM | POA: Insufficient documentation

## 2014-08-12 MED ORDER — SILDENAFIL CITRATE 20 MG PO TABS: ORAL_TABLET | ORAL | Status: DC

## 2014-08-12 NOTE — Progress Notes (Signed)
Pre visit review using our clinic review tool, if applicable. No additional management support is needed unless otherwise documented below in the visit note. 

## 2014-08-12 NOTE — Progress Notes (Signed)
Subjective: 54 year old male presents with painful ingrown nail right great toe lateral border, which happened after getting a pedicure last week.  Been soaking with Epson salt water for the last couple of days.  He used to get ingrown nails after run tracks in past.   Objective: Dermatologic: Inflamed nail border with mild drainage right hallux lateral border localized. No proximal cellulitis associated with ingrown nail. Vascular: All pedal pulses are palpable.  Neurologic: All epicritic and tactile sensations are grossly intact. Orthopedic: No abnormal findings.  Assessment:  Inflamed ingrown nail right hallux lateral border with abnormal nail growth.  Plan: Reviewed findings and available treatment options. Procedure done: Phenol and Alcohol matrixectomy right hallux lateral border.  Affected toe was anesthetized with total 79ml mixture of 50/50 0.5% Marcaine plain and 1% Xylocaine plain. Affected lateral nail border was reflected with a nail elevator and cut out with nail nipper. Proximal nail matrix tissue was cauterized with Phenol soaked cotton applicator x 4 and neutralized with Alcohol soaked cotton applicator. The wound was dressed with Amerigel ointment dressing. Home care instructions and supply dispensed.  Return in 1 week for follow up.

## 2014-08-12 NOTE — Patient Instructions (Signed)
I will set up colonoscopy.

## 2014-08-12 NOTE — Progress Notes (Signed)
Subjective:    Patient ID: Paul Bird, male    DOB: [birth year], 54 y.o.   MRN: [id number]  HPI Patient seen for complete physical and for evaluation of separate problem.  History of erectile dysfunction. He also has history of ADD. He does not take any medications regularly. Exercises several days per week. Has never had screening colonoscopy. Nonsmoker. Complains of intermittent episodes of left upper extremity numbness over the past 2-3 months. Symptoms are progressive. Numbness involves all of the left arm and left hand. He denies any clear weakness. No cervical neck pain. No hand or wrist pain. No arm edema. Denies any lower extremity symptoms. No headaches.  Past Medical History  Diagnosis Date  . Erectile dysfunction   . Actinic keratosis 04/19/2009    Qualifier: Diagnosis of  By: Joyce Gross    . 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 DYSESTHESIA 09/08/2009    Qualifier: Diagnosis of  By: Elease Hashimoto MD, Bruce     Past Surgical History  Procedure Laterality Date  . Rotator cuff repair      reports that he has never smoked. He does not have any smokeless tobacco history on file. His alcohol and drug histories are not on file. family history includes Heart disease (age of onset: 7) in his father. No Known Allergies      Review of Systems  Constitutional: Negative for fever, activity change, appetite change and fatigue.  HENT: Negative for congestion, ear pain and trouble swallowing.   Eyes: Negative for pain and visual disturbance.  Respiratory: Negative for cough, shortness of breath and wheezing.   Cardiovascular: Negative for chest pain and palpitations.  Gastrointestinal: Negative for nausea, vomiting, abdominal pain, diarrhea, constipation, blood in stool, abdominal distention and rectal pain.  Genitourinary: Negative for dysuria, hematuria and testicular pain.  Musculoskeletal: Negative for joint swelling  and arthralgias.  Skin: Negative for rash.  Neurological: Positive for numbness. Negative for dizziness, syncope, weakness and headaches.  Hematological: Negative for adenopathy.  Psychiatric/Behavioral: Negative for confusion and dysphoric mood.       Objective:   Physical Exam  Constitutional: He is oriented to person, place, and time. He appears well-developed and well-nourished. No distress.  HENT:  Head: Normocephalic and atraumatic.  Right Ear: External ear normal.  Left Ear: External ear normal.  Mouth/Throat: Oropharynx is clear and moist.  Eyes: Conjunctivae and EOM are normal. Pupils are equal, round, and reactive to light.  Neck: Normal range of motion. Neck supple. No thyromegaly present.  Cardiovascular: Normal rate, regular rhythm and normal heart sounds.   No murmur heard. Pulmonary/Chest: No respiratory distress. He has no wheezes. He has no rales.  Abdominal: Soft. Bowel sounds are normal. He exhibits no distension and no mass. There is no tenderness. There is no rebound and no guarding.  Musculoskeletal: He exhibits no edema.  Left upper extremity no edema. No muscle atrophy.  Lymphadenopathy:    He has no cervical adenopathy.  Neurological: He is alert and oriented to person, place, and time. He displays normal reflexes. No cranial nerve deficit.  Full strength left upper extremity with symmetric reflexes  Skin: No rash noted.  Psychiatric: He has a normal mood and affect.          Assessment & Plan  #1 complete physical. Schedule screening colonoscopy. He was scheduled last year but never followed through. He is willing to go this year. Continue regular exercise  habits. Labs reviewed with no major concerns #2 left upper extremity numbness. Uncertain etiology. He denies any cervical neck pain. No symptoms to suggest carpal tunnel or peripheral nerve compression. Doubt central. He does not describe numbness following any specific nerve distribution. Set up  nerve conduction studies to further assess

## 2014-08-18 ENCOUNTER — Telehealth: Payer: Self-pay | Admitting: Family Medicine

## 2014-08-18 DIAGNOSIS — Z1211 Encounter for screening for malignant neoplasm of colon: Secondary | ICD-10-CM

## 2014-08-18 NOTE — Telephone Encounter (Signed)
Referral has been made.  Pt should hear over the next week.

## 2014-08-18 NOTE — Telephone Encounter (Signed)
Pt following up on referral for colonoscopy.

## 2014-08-19 ENCOUNTER — Encounter: Payer: Self-pay | Admitting: Internal Medicine

## 2014-08-22 ENCOUNTER — Encounter: Payer: Commercial Managed Care - PPO | Admitting: Podiatry

## 2014-09-06 ENCOUNTER — Ambulatory Visit (INDEPENDENT_AMBULATORY_CARE_PROVIDER_SITE_OTHER): Payer: Commercial Managed Care - PPO | Admitting: Neurology

## 2014-09-06 DIAGNOSIS — M5412 Radiculopathy, cervical region: Secondary | ICD-10-CM

## 2014-09-06 DIAGNOSIS — R200 Anesthesia of skin: Secondary | ICD-10-CM

## 2014-09-06 DIAGNOSIS — R208 Other disturbances of skin sensation: Secondary | ICD-10-CM | POA: Diagnosis not present

## 2014-09-06 NOTE — Procedures (Signed)
[facility]  [address]  [place], [place] [address] Tel: [phone] Fax:  [phone] Test Date:  09/06/2014  Patient: Paul Bird DOB: 19-Oct-1960 Physician: Narda Amber, DO  Sex: Male Height: 6\' 0"  Ref Phys: Carolann Littler  ID#: [id number] Temp: 33.4C Technician: Jerilynn Mages. Dean   Patient Complaints: This is a 54 year old presenting for evaluation of neck pain radiating into his left arm and hand.  NCV & EMG Findings: Extensive electrodiagnostic testing of the left upper extremity and additional studies of the right shows:  1. Left median, radial, ulnar, and palmar sensory responses are within normal limits. 2. Left median and ulnar motor responses are within normal limits. 3. Chronic motor axon loss changes are seen affecting the pronator teres and triceps muscles on the left side, without accompanied active denervation.  Impression: 1. 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 2. There is no evidence of carpal tunnel syndrome or an ulnar neuropathy affecting the left upper extremity.   ___________________________ Narda Amber, DO    Nerve Conduction Studies Anti Sensory Summary Table   Site NR Peak (ms) Norm Peak (ms) P-T Amp (V) Norm P-T Amp  Left Median Anti Sensory (2nd Digit)  Wrist    2.9 <3.6 19.5 >15  Left Radial Anti Sensory (Base 1st Digit)  Wrist    2.0 <2.7 26.1 >14  Left Ulnar Anti Sensory (5th Digit)    Longer distance used 13cm  Wrist    3.3 <3.1 14.3 >10   Motor Summary Table   Site NR Onset (ms) Norm Onset (ms) O-P Amp (mV) Norm O-P Amp Site1 Site2 Delta-0 (ms) Dist (cm) Vel (m/s) Norm Vel (m/s)  Left Median Motor (Abd Poll Brev)  Wrist    3.3 <4.0 8.8 >6 Elbow Wrist 5.1 27.0 53 >50  Elbow    8.4  8.7         Left Ulnar Motor (Abd Dig Minimi)  Wrist    2.7 <3.1 10.7 >7 B Elbow Wrist 4.3 23.0 53 >50  B Elbow    7.0  10.5  A Elbow B Elbow 1.7 10.0 59 >50  A Elbow    8.7  10.0           Comparison Summary Table   Site NR Peak (ms) Norm Peak (ms) P-T Amp (V) Site1 Site2 Delta-P (ms) Norm Delta (ms)  Left Median/Ulnar Palm Comparison (Wrist - 8cm)  Median Palm    1.8 <2.2 78.2 Median Palm Ulnar Palm 0.2   Ulnar Palm    2.0 <2.2 29.7       EMG   Side Muscle Ins Act Fibs Psw Fasc Number Recrt Dur Dur. Amp Amp. Poly Poly. Comment  Left 1stDorInt Nml Nml Nml Nml Nml Nml Nml Nml Nml Nml Nml Nml N/A  Left Ext Indicis Nml Nml Nml Nml Nml Nml Nml Nml Nml Nml Nml Nml N/A  Left PronatorTeres Nml Nml Nml Nml 1- Mod-R Few 1+ Nml Nml Nml Nml N/A  Left Biceps Nml Nml Nml Nml Nml Nml Nml Nml Nml Nml Nml Nml N/A  Left Triceps Nml Nml Nml Nml 1- Mod-R Few 1+ Nml Nml Nml Nml N/A  Left Deltoid Nml Nml Nml Nml Nml Nml Nml Nml Nml Nml Nml Nml N/A  Right Biceps Nml Nml Nml Nml Nml Nml Nml Nml Nml Nml Nml Nml N/A  Right Triceps Nml Nml Nml Nml Nml Nml Nml Nml Nml Nml Nml Nml N/A  Waveforms:

## 2014-09-08 ENCOUNTER — Telehealth: Payer: Self-pay | Admitting: Family Medicine

## 2014-09-08 DIAGNOSIS — M5412 Radiculopathy, cervical region: Secondary | ICD-10-CM

## 2014-09-08 NOTE — Telephone Encounter (Signed)
Pt called about neuro results and would like a call back

## 2014-09-11 NOTE — Telephone Encounter (Signed)
Pt has been called back.  Progressive symptoms and NCV suggest left C7 radiculopathy.  Will set up cervical MRI to further assess.

## 2014-09-14 ENCOUNTER — Ambulatory Visit
Admission: RE | Admit: 2014-09-14 | Discharge: 2014-09-14 | Disposition: A | Discharge status: Home / Self Care | Payer: Commercial Managed Care - PPO | Source: Ambulatory Visit | Attending: Family Medicine | Admitting: Family Medicine

## 2014-09-14 DIAGNOSIS — M5412 Radiculopathy, cervical region: Secondary | ICD-10-CM

## 2014-09-15 NOTE — Telephone Encounter (Signed)
Pt called back for MRI results

## 2014-09-16 NOTE — Addendum Note (Signed)
Addended by: [name] on: 09/16/2014 08:39 AM   Modules accepted: Orders

## 2014-10-04 ENCOUNTER — Encounter: Payer: Commercial Managed Care - PPO | Admitting: Internal Medicine

## 2014-10-06 ENCOUNTER — Ambulatory Visit (AMBULATORY_SURGERY_CENTER): Payer: Self-pay

## 2014-10-06 VITALS — Ht 73.0 in | Wt 198.0 lb

## 2014-10-06 DIAGNOSIS — Z1211 Encounter for screening for malignant neoplasm of colon: Secondary | ICD-10-CM

## 2014-10-06 NOTE — Progress Notes (Signed)
No allergies to eggs or soy No diet/weight loss meds No home oxygen No past problems with anesthesia  Does not want emmi instructions

## 2014-10-20 ENCOUNTER — Encounter: Payer: Self-pay | Admitting: Internal Medicine

## 2014-10-20 ENCOUNTER — Ambulatory Visit (AMBULATORY_SURGERY_CENTER): Payer: Commercial Managed Care - PPO | Admitting: Internal Medicine

## 2014-10-20 VITALS — BP 98/63 | HR 58 | Temp 97.3°F | Resp 15 | Ht 73.0 in | Wt 198.0 lb

## 2014-10-20 DIAGNOSIS — D122 Benign neoplasm of ascending colon: Secondary | ICD-10-CM

## 2014-10-20 DIAGNOSIS — D123 Benign neoplasm of transverse colon: Secondary | ICD-10-CM | POA: Diagnosis not present

## 2014-10-20 DIAGNOSIS — Z1211 Encounter for screening for malignant neoplasm of colon: Secondary | ICD-10-CM

## 2014-10-20 MED ORDER — SODIUM CHLORIDE 0.9 % IV SOLN: 500.0000 mL | INTRAVENOUS | Status: DC

## 2014-10-20 NOTE — Progress Notes (Signed)
Called to room to assist during endoscopic procedure.  Patient ID and intended procedure confirmed with present staff. Received instructions for my participation in the procedure from the performing physician.  

## 2014-10-20 NOTE — Op Note (Addendum)
[facility]  [address] [place], [address]   COLONOSCOPY PROCEDURE REPORT  PATIENT: Paul, Bird  MR#: [id number] BIRTHDATE: [birth year] , 63  yrs. old GENDER: male ENDOSCOPIST: Jerene Bears, MD REFERRED GH:WEXHB Burchette, M.D. PROCEDURE DATE:  10/20/2014 PROCEDURE:   Colonoscopy, screening and Colonoscopy with snare polypectomy First Screening Colonoscopy - Avg.  risk and is 50 yrs.  old or older Yes.  Prior Negative Screening - Now for repeat screening. N/A  History of Adenoma - Now for follow-up colonoscopy & has been > or = to 3 yrs.  N/A  Polyps removed today? Yes ASA CLASS:   Class I INDICATIONS:Screening for colonic neoplasia and Colorectal Neoplasm Risk Assessment for this procedure is average risk. MEDICATIONS: Propofol 330 mg IV and Monitored anesthesia care  DESCRIPTION OF PROCEDURE:   After the risks benefits and alternatives of the procedure were thoroughly explained, informed consent was obtained.  The digital rectal exam revealed no rectal mass.   The LB [id number]  endoscope was introduced through the anus and advanced to the cecum, which was identified by both the appendix and ileocecal valve. No adverse events experienced. The quality of the prep was good.  (MiraLax was used)  The instrument was then slowly withdrawn as the colon was fully examined. Estimated blood loss is zero unless otherwise noted in this procedure report.   COLON FINDINGS: Four sessile polyps ranging between 3-12mm in size were found in the ascending colon (1) and transverse colon (3). Polypectomies were performed with a cold snare.  The resection was complete, the polyp tissue was completely retrieved and sent to histology.   The examination was otherwise normal.  Retroflexed views revealed internal hemorrhoids. The time to cecum = 2.2 Withdrawal time = 16.3   The scope was withdrawn and the procedure completed. COMPLICATIONS: There were no immediate  complications.  ENDOSCOPIC IMPRESSION: 1.   Four sessile polyps ranging between 3-32mm in size were found in the ascending colon and transverse colon; polypectomies were performed with a cold snare 2.   The examination was otherwise normal  RECOMMENDATIONS: 1.  Await pathology results 2.  If the polyps removed today are proven to be adenomatous (pre-cancerous) polyps, you will need a colonoscopy in 3 years. Otherwise you should continue to follow colorectal cancer screening guidelines for "routine risk" patients with a colonoscopy in 10 years.  You will receive a letter within 1-2 weeks with the results of your biopsy as well as final recommendations.  Please call my office if you have not received a letter after 3 weeks.  eSigned:  Jerene Bears, MD 10/20/2014 3:16 PM Revised: 10/20/2014 3:16 PM  cc: Carolann Littler, MD and The Patient

## 2014-10-20 NOTE — Progress Notes (Signed)
A/ox3 pleased with MAC, report to Kristen RN 

## 2014-10-20 NOTE — Patient Instructions (Signed)
YOU HAD AN ENDOSCOPIC PROCEDURE TODAY AT [facility]:   Refer to the procedure report that was given to you for any specific questions about what was found during the examination.  If the procedure report does not answer your questions, please call your gastroenterologist to clarify.  If you requested that your care partner not be given the details of your procedure findings, then the procedure report has been included in a sealed envelope for you to review at your convenience later.  YOU SHOULD EXPECT: Some feelings of bloating in the abdomen. Passage of more gas than usual.  Walking can help get rid of the air that was put into your GI tract during the procedure and reduce the bloating. If you had a lower endoscopy (such as a colonoscopy or flexible sigmoidoscopy) you may notice spotting of blood in your stool or on the toilet paper. If you underwent a bowel prep for your procedure, you may not have a normal bowel movement for a few days.  Please Note:  You might notice some irritation and congestion in your nose or some drainage.  This is from the oxygen used during your procedure.  There is no need for concern and it should clear up in a day or so.  SYMPTOMS TO REPORT IMMEDIATELY:   Following lower endoscopy (colonoscopy or flexible sigmoidoscopy):  Excessive amounts of blood in the stool  Significant tenderness or worsening of abdominal pains  Swelling of the abdomen that is new, acute  Fever of 100F or higher  For urgent or emergent issues, a gastroenterologist can be reached at any hour by calling [phone].   DIET: Your first meal following the procedure should be a small meal and then it is ok to progress to your normal diet. Heavy or fried foods are harder to digest and may make you feel nauseous or bloated.  Likewise, meals heavy in dairy and vegetables can increase bloating.  Drink plenty of fluids but you should avoid alcoholic beverages for 24  hours.  ACTIVITY:  You should plan to take it easy for the rest of today and you should NOT DRIVE or use heavy machinery until tomorrow (because of the sedation medicines used during the test).    FOLLOW UP: Our staff will call the number listed on your records the next business day following your procedure to check on you and address any questions or concerns that you may have regarding the information given to you following your procedure. If we do not reach you, we will leave a message.  However, if you are feeling well and you are not experiencing any problems, there is no need to return our call.  We will assume that you have returned to your regular daily activities without incident.  If any biopsies were taken you will be contacted by phone or by letter within the next 1-3 weeks.  Please call us at [phone] if you have not heard about the biopsies in 3 weeks.   SIGNATURES/CONFIDENTIALITY: You and/or your care partner have signed paperwork which will be entered into your electronic medical record.  These signatures attest to the fact that that the information above on your After Visit Summary has been reviewed and is understood.  Full responsibility of the confidentiality of this discharge information lies with you and/or your care-partner.  Continue your normal medications  Please read over handout about polyps

## 2014-10-21 ENCOUNTER — Telehealth: Payer: Self-pay | Admitting: *Deleted

## 2014-10-21 NOTE — Telephone Encounter (Signed)
No answer, left message to call if questions or concerns., 

## 2014-10-26 ENCOUNTER — Encounter: Payer: Self-pay | Admitting: Internal Medicine

## 2014-11-15 ENCOUNTER — Telehealth: Payer: Self-pay | Admitting: Family Medicine

## 2014-11-15 NOTE — Telephone Encounter (Signed)
Rx was taking off of med list. Pt states that he take the medication just not everyday.  Last visit  08/12/14

## 2014-11-15 NOTE — Telephone Encounter (Signed)
Pt request refill of the following:   VYVANSE    Phamacy:

## 2014-11-15 NOTE — Telephone Encounter (Signed)
Refill OK

## 2014-11-16 MED ORDER — LISDEXAMFETAMINE DIMESYLATE 60 MG PO CAPS: 60.0000 mg | ORAL_CAPSULE | ORAL | Status: DC

## 2014-11-16 NOTE — Telephone Encounter (Signed)
Left message on [facility] that Rx is ready for pickup  

## 2015-10-27 ENCOUNTER — Other Ambulatory Visit (INDEPENDENT_AMBULATORY_CARE_PROVIDER_SITE_OTHER): Payer: Managed Care, Other (non HMO)

## 2015-10-27 DIAGNOSIS — Z0000 Encounter for general adult medical examination without abnormal findings: Secondary | ICD-10-CM

## 2015-10-27 LAB — BASIC METABOLIC PANEL
BUN: 22 mg/dL (ref 6–23)
CO2: 27 meq/L (ref 19–32)
Calcium: 9.7 mg/dL (ref 8.4–10.5)
Chloride: 106 mEq/L (ref 96–112)
Creatinine, Ser: 1.13 mg/dL (ref 0.40–1.50)
GFR: 71.68 mL/min (ref >60.00)
GLUCOSE: 97 mg/dL (ref 70–99)
Potassium: 4.3 mEq/L (ref 3.5–5.1)
SODIUM: 141 meq/L (ref 135–145)

## 2015-10-27 LAB — HEPATIC FUNCTION PANEL
ALT: 22 U/L (ref 0–53)
AST: 25 U/L (ref 0–37)
Albumin: 4.4 g/dL (ref 3.5–5.2)
Alkaline Phosphatase: 76 U/L (ref 39–117)
Bilirubin, Direct: 0.1 mg/dL (ref 0.0–0.3)
TOTAL PROTEIN: 7.6 g/dL (ref 6.0–8.3)
Total Bilirubin: 0.6 mg/dL (ref 0.2–1.2)

## 2015-10-27 LAB — CBC WITH DIFFERENTIAL/PLATELET
BASOS PCT: 0.3 % (ref 0.0–3.0)
Basophils Absolute: 0 10*3/uL (ref 0.0–0.1)
Eosinophils Absolute: 0.2 10*3/uL (ref 0.0–0.7)
Eosinophils Relative: 2.9 % (ref 0.0–5.0)
HEMATOCRIT: 42.6 % (ref 39.0–52.0)
Hemoglobin: 14.6 g/dL (ref 13.0–17.0)
LYMPHS PCT: 24.1 % (ref 12.0–46.0)
Lymphs Abs: 1.8 10*3/uL (ref 0.7–4.0)
MCHC: 34.3 g/dL (ref 30.0–36.0)
MCV: 88.2 fl (ref 78.0–100.0)
Monocytes Absolute: 0.5 10*3/uL (ref 0.1–1.0)
Monocytes Relative: 6.7 % (ref 3.0–12.0)
NEUTROS ABS: 4.8 10*3/uL (ref 1.4–7.7)
Neutrophils Relative %: 66 % (ref 43.0–77.0)
Platelets: 263 10*3/uL (ref 150.0–400.0)
RBC: 4.83 Mil/uL (ref 4.22–5.81)
RDW: 14.3 % (ref 11.5–15.5)
WBC: 7.3 10*3/uL (ref 4.0–10.5)

## 2015-10-27 LAB — LIPID PANEL
CHOLESTEROL: 205 mg/dL — AB (ref 0–200)
HDL: 60.7 mg/dL (ref >39.00)
LDL Cholesterol: 133 mg/dL — ABNORMAL HIGH (ref 0–99)
NonHDL: 143.99
TRIGLYCERIDES: 57 mg/dL (ref 0.0–149.0)
Total CHOL/HDL Ratio: 3
VLDL: 11.4 mg/dL (ref 0.0–40.0)

## 2015-10-27 LAB — PSA: PSA: 2.15 ng/mL (ref 0.10–4.00)

## 2015-10-27 LAB — TSH: TSH: 0.77 u[IU]/mL (ref 0.35–4.50)

## 2015-11-03 ENCOUNTER — Ambulatory Visit (INDEPENDENT_AMBULATORY_CARE_PROVIDER_SITE_OTHER): Payer: Managed Care, Other (non HMO) | Admitting: Family Medicine

## 2015-11-03 ENCOUNTER — Encounter: Payer: Self-pay | Admitting: Family Medicine

## 2015-11-03 VITALS — BP 132/84 | HR 75 | Temp 97.9°F | Ht 73.0 in | Wt 190.2 lb

## 2015-11-03 DIAGNOSIS — Z0000 Encounter for general adult medical examination without abnormal findings: Secondary | ICD-10-CM

## 2015-11-03 MED ORDER — SILDENAFIL CITRATE 20 MG PO TABS: ORAL_TABLET | ORAL | 1 refills | Status: DC

## 2015-11-03 NOTE — Progress Notes (Signed)
Subjective:     Patient ID: Paul Bird, male   DOB: December 20, 1960, 55 y.o.   MRN: [id number]  HPI Patient seen for physical exam. He had cervical neck surgery several months ago and recovered well from that. He is back to regular exercise. His numbness in his arm has fully resolved. Denies any weakness. He has erectile dysfunction and requesting refills of Viagra which has worked well in the past.  Colonoscopy up-to-date. Immunizations up-to-date. Patient is concerned because his mother has Alzheimer's dementia. Has recently noticed some difficulties with things like forgetting names.  Past Medical History:  Diagnosis Date  . Actinic keratosis 04/19/2009   Qualifier: Diagnosis of  By: Joyce Gross    . DYSESTHESIA 09/08/2009   Qualifier: Diagnosis of  By: Elease Hashimoto MD, [place] Fergeson    . Erectile dysfunction   . 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 ROTATOR CUFF REPAIR  2001   right    reports that he has never smoked. He has never used smokeless tobacco. He reports that he drinks alcohol. He reports that he does not use drugs. family history includes Heart disease (age of onset: 56) in his father. No Known Allergies   Review of Systems  Constitutional: Negative for activity change, appetite change, fatigue and fever.  HENT: Negative for congestion, ear pain and trouble swallowing.   Eyes: Negative for pain and visual disturbance.  Respiratory: Negative for cough, shortness of breath and wheezing.   Cardiovascular: Negative for chest pain and palpitations.  Gastrointestinal: Negative for abdominal distention, abdominal pain, blood in stool, constipation, diarrhea, nausea, rectal pain and vomiting.  Genitourinary: Negative for dysuria, hematuria and testicular pain.  Musculoskeletal: Negative for arthralgias and joint swelling.  Skin: Negative for rash.  Neurological: Negative for  dizziness, syncope and headaches.  Hematological: Negative for adenopathy.  Psychiatric/Behavioral: Negative for confusion and dysphoric mood.       Objective:   Physical Exam  Constitutional: He is oriented to person, place, and time. He appears well-developed and well-nourished. No distress.  HENT:  Head: Normocephalic and atraumatic.  Right Ear: External ear normal.  Left Ear: External ear normal.  Mouth/Throat: Oropharynx is clear and moist.  Eyes: Conjunctivae and EOM are normal. Pupils are equal, round, and reactive to light.  Neck: Normal range of motion. Neck supple. No thyromegaly present.  Cardiovascular: Normal rate, regular rhythm and normal heart sounds.   No murmur heard. Pulmonary/Chest: No respiratory distress. He has no wheezes. He has no rales.  Abdominal: Soft. Bowel sounds are normal. He exhibits no distension and no mass. There is no tenderness. There is no rebound and no guarding.  Musculoskeletal: He exhibits no edema.  Lymphadenopathy:    He has no cervical adenopathy.  Neurological: He is alert and oriented to person, place, and time. He displays normal reflexes. No cranial nerve deficit.  Skin: No rash noted.  Psychiatric: He has a normal mood and affect.  MMSE 29/30-missed one word with 3 word recall       Assessment:     Physical exam. Generally healthy 62 old male.    Plan:     -Labs reviewed with patient with no major concerns -PSA 2.15 which is up from 1.47 last year but down from 3.46 four years ago. Continue to monitor yearly -Continue regular exercise habits -Continue yearly flu vaccination -Consider repeat MMSE at follow-up in one year  [name] MD [facility] at [facility]

## 2015-11-03 NOTE — Progress Notes (Signed)
Pre visit review using our clinic review tool, if applicable. No additional management support is needed unless otherwise documented below in the visit note. 

## 2016-01-08 ENCOUNTER — Encounter: Payer: Self-pay | Admitting: Family Medicine

## 2016-01-08 ENCOUNTER — Ambulatory Visit (INDEPENDENT_AMBULATORY_CARE_PROVIDER_SITE_OTHER): Payer: Managed Care, Other (non HMO) | Admitting: Family Medicine

## 2016-01-08 VITALS — BP 132/80 | HR 67 | Temp 98.2°F | Resp 20 | Ht 73.0 in | Wt 200.0 lb

## 2016-01-08 DIAGNOSIS — M7042 Prepatellar bursitis, left knee: Secondary | ICD-10-CM

## 2016-01-08 MED ORDER — MELOXICAM 15 MG PO TABS: 15.0000 mg | ORAL_TABLET | Freq: Every day | ORAL | 0 refills | Status: DC

## 2016-01-08 NOTE — Progress Notes (Signed)
Pre visit review using our clinic review tool, if applicable. No additional management support is needed unless otherwise documented below in the visit note. 

## 2016-01-08 NOTE — Progress Notes (Signed)
Subjective:     Patient ID: Paul Bird, male   DOB: [birth year], 55 y.o.   MRN: [id number]  HPI Patient seen with left knee pain and swelling. About 4 weeks ago he was lifting some heavy mirrors and lost balance and fell, landing on his left knee. No abrasion. He did not note any swelling or difficulty ambulating afterwards but then a couple weeks ago noticed some swelling of the left prepatellar bursa. He had some warmth and slight soreness. Has not taken any medications. Has not tried any icing. Denies any other specific injury. No fevers or chills.  Past Medical History:  Diagnosis Date  . Actinic keratosis 04/19/2009   Qualifier: Diagnosis of  By: Joyce Gross    . DYSESTHESIA 09/08/2009   Qualifier: Diagnosis of  By: Elease Hashimoto MD, [place]    . Erectile dysfunction   . 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 ROTATOR CUFF REPAIR  2001   right    reports that he has never smoked. He has never used smokeless tobacco. He reports that he drinks alcohol. He reports that he does not use drugs. family history includes Heart disease (age of onset: 16) in his father. No Known Allergies   Review of Systems  Constitutional: Negative for chills and fever.       Objective:   Physical Exam  Constitutional: He appears well-developed and well-nourished. No distress.  Cardiovascular: Normal rate and regular rhythm.   Pulmonary/Chest: Effort normal and breath sounds normal. No respiratory distress. He has no wheezes. He has no rales.  Musculoskeletal:  Left knee reveals obvious swelling prepatellar bursa. He has slight warmth. Minimal tenderness to palpation. No evidence for knee effusion. Full range of motion.       Assessment:     Left prepatellar bursitis-suspect related to recent fall.    Plan:     -Recommend try icing couple times daily -Avoid any pressure on his prepatellar  bursa -Avoid repetitive squatting -Meloxicam 15 mg once daily -Touch base in a few weeks if not improving further  [name] MD [facility] at [facility]

## 2016-01-08 NOTE — Patient Instructions (Signed)
Prepatellar Bursitis With Rehab  Bursitis is a condition that is characterized by inflammation of a bursa. Saunders Revel exists in many areas of the body. They are fluid-filled sacs that lie between a soft tissue (skin, tendon, or ligament) and a bone, and they reduce friction between the structures as well as the stress placed on the soft tissue. Prepatellar bursitis is inflammation of the bursa that lies between the skin and the kneecap (patella). This condition often causes pain over the patella. SYMPTOMS   Pain, tenderness, and/or inflammation over the patella.  Pain that worsens with movement of the knee joint.  Decreased range of motion for the knee joint.  A crackling sound (crepitation) when the bursa is moved or touched.  Occasionally, painless swelling of the bursa.  Fever (when infected). CAUSES  Bursitis is caused by damage to the bursa, which results in an inflammatory response. Common mechanisms of injury include:  Direct trauma to the front of the knee.  Repetitive and/or stressful use of the knee. RISK INCREASES WITH:  Activities in which kneeling and/or falling on one's knees is likely (volleyball or football).  Repetitive and stressful training, especially if it involves running on hills.  Improper training techniques, such as a sudden increase in the intensity, frequency, or duration of training.  Failure to warm up properly before activity.  Poor technique.  Artificial turf. PREVENTION   Avoid kneeling or falling on your knees.  Warm up and stretch properly before activity.  Allow for adequate recovery between workouts.  Maintain physical fitness:  Strength, flexibility, and endurance.  Cardiovascular fitness.  Learn and use proper technique. When possible, have a coach correct improper technique.  Wear properly fitted and padded protective equipment (knee pads). PROGNOSIS  If treated properly, then the symptoms of prepatellar bursitis usually resolve  within 2 weeks. RELATED COMPLICATIONS   Recurrent symptoms that result in a chronic problem.  Prolonged healing time, if improperly treated or reinjured.  Limited range of motion.  Infection of bursa.  Chronic inflammation or scarring of bursa. TREATMENT  Treatment initially involves the use of ice and medication to help reduce pain and inflammation. The use of strengthening and stretching exercises may help reduce pain with activity, especially those of the quadriceps and hamstring muscles. These exercises may be performed at home or with referral to a therapist. Your caregiver may recommend knee pads when you return to playing sports, in order to reduce the stress on the prepatellar bursa. If symptoms persist despite treatment, then your caregiver may drain fluid out with a needle (aspirate) the bursa. If symptoms persist for greater than 6 months despite nonsurgical (conservative) treatment, then surgery may be recommended to remove the bursa.  MEDICATION  If pain medication is necessary, then nonsteroidal anti-inflammatory medications, such as aspirin and ibuprofen, or other minor pain relievers, such as acetaminophen, are often recommended.  Do not take pain medication for 7 days before surgery.  Prescription pain relievers may be given if deemed necessary by your caregiver. Use only as directed and only as much as you need.  Corticosteroid injections may be given by your caregiver. These injections should be reserved for the most serious cases, because they may only be given a certain number of times. HEAT AND COLD  Cold treatment (icing) relieves pain and reduces inflammation. Cold treatment should be applied for 10 to 15 minutes every 2 to 3 hours for inflammation and pain and immediately after any activity that aggravates your symptoms. Use ice packs or massage the  area with a piece of ice (ice massage). °· Heat treatment may be used prior to performing the stretching and  strengthening activities prescribed by your caregiver, physical therapist, or athletic trainer. Use a heat pack or soak the injury in warm water. °SEEK MEDICAL CARE IF: °· Treatment seems to offer no benefit, or the condition worsens. °· Any medications produce adverse side effects. °EXERCISES °RANGE OF MOTION (ROM) AND STRETCHING EXERCISES - Prepatellar Bursitis °These exercises may help you when beginning to rehabilitate your injury. Your symptoms may resolve with or without further involvement from your physician, physical therapist or athletic trainer. While completing these exercises, remember:  °· Restoring tissue flexibility helps normal motion to return to the joints. This allows healthier, less painful movement and activity. °· An effective stretch should be held for at least 30 seconds. °· A stretch should never be painful. You should only feel a gentle lengthening or release in the stretched tissue. °STRETCH - Hamstrings, Standing °· Stand or sit and extend your right / left leg, placing your foot on a chair or foot stool °· Keeping a slight arch in your low back and your hips straight forward. °· Lead with your chest and lean forward at the waist until you feel a gentle stretch in the back of your right / left knee or thigh. (When done correctly, this exercise requires leaning only a small distance.) °· Hold this position for __________ seconds. °Repeat __________ times. Complete this stretch __________ times per day. °STRETCH - Quadriceps, Prone  °· Lie on your stomach on a firm surface, such as a bed or padded floor. °· Bend your right / left knee and grasp your ankle. If you are unable to reach, your ankle or pant leg, use a belt around your foot to lengthen your reach. °· Gently pull your heel toward your buttocks. Your knee should not slide out to the side. You should feel a stretch in the front of your thigh and/or knee. °· Hold this position for __________ seconds. °Repeat __________ times.  Complete this stretch __________ times per day.  °STRETCH - Hamstrings/Adductors, V-Sit  °· Sit on the floor with your legs extended in a large "V," keeping your knees straight. °· With your head and chest upright, bend at your waist reaching for your right foot to stretch your left adductors. °· You should feel a stretch in your left inner thigh. Hold for __________ seconds. °· Return to the upright position to relax your leg muscles. °· Continuing to keep your chest upright, bend straight forward at your waist to stretch your hamstrings. °· You should feel a stretch behind both of your thighs and/or knees. Hold for __________ seconds. °· Return to the upright position to relax your leg muscles. °· Repeat steps 2 through 4. °Repeat __________ times. Complete this exercise __________ times per day.  °STRENGTHENING EXERCISES - Prepatellar Bursitis °· These exercises may help you when beginning to rehabilitate your injury. They may resolve your symptoms with or without further involvement from your physician, physical therapist or athletic trainer. While completing these exercises, remember: °· Muscles can gain both the endurance and the strength needed for everyday activities through controlled exercises. °· Complete these exercises as instructed by your physician, physical therapist or athletic trainer. Progress the resistance and repetitions only as guided. °STRENGTH - Quadriceps, Isometrics °· Lie on your back with your right / left leg extended and your opposite knee bent. °· Gradually tense the muscles in the front of your right /   left thigh. You should see either your kneecap slide up toward your hip or increased dimpling just above the knee. This motion will push the back of the knee down toward the floor/mat/bed on which you are lying.  Hold the muscle as tight as you can without increasing your pain for __________ seconds.  Relax the muscles slowly and completely in between each repetition. Repeat  __________ times. Complete this exercise __________ times per day.  STRENGTH - Quadriceps, Short Arcs   Lie on your back. Place a __________ inch towel roll under your knee so that the knee slightly bends.  Raise only your lower leg by tightening the muscles in the front of your thigh. Do not allow your thigh to rise.  Hold this position for __________ seconds. Repeat __________ times. Complete this exercise __________ times per day.  OPTIONAL ANKLE WEIGHTS: Begin with ____________________, but DO NOT exceed ____________________. Increase in1 lb/0.5 kg increments.  STRENGTH - Quadriceps, Straight Leg Raises  Quality counts! Watch for signs that the quadriceps muscle is working to insure you are strengthening the correct muscles and not "cheating" by substituting with healthier muscles.  Lay on your back with your right / left leg extended and your opposite knee bent.  Tense the muscles in the front of your right / left thigh. You should see either your kneecap slide up or increased dimpling just above the knee. Your thigh may even quiver.  Tighten these muscles even more and raise your leg 4 to 6 inches off the floor. Hold for __________ seconds.  Keeping these muscles tense, lower your leg.  Relax the muscles slowly and completely in between each repetition. Repeat __________ times. Complete this exercise __________ times per day.  STRENGTH - Quadriceps, Step-Ups   Use a thick book, step or step stool that is __________ inches tall.  Holding a wall or counter for balance only, not support.  Slowly step-up with your right / left foot, keeping your knee in line with your hip and foot. Do not allow your knee to bend so far that you cannot see your toes.  Slowly unlock your knee and lower yourself to the starting position. Your muscles, not gravity, should lower you. Repeat __________ times. Complete this exercise __________ times per day.   This information is not intended to replace  advice given to you by your health care provider. Make sure you discuss any questions you have with your health care provider.   Document Released: 03/18/2005 Document Revised: 12/07/2014 Document Reviewed: 06/30/2008 Elsevier Interactive Patient Education [employer].

## 2016-02-04 ENCOUNTER — Other Ambulatory Visit: Payer: Self-pay | Admitting: Family Medicine

## 2016-09-09 ENCOUNTER — Encounter: Payer: Self-pay | Admitting: Family Medicine

## 2016-09-09 ENCOUNTER — Ambulatory Visit (INDEPENDENT_AMBULATORY_CARE_PROVIDER_SITE_OTHER): Payer: Managed Care, Other (non HMO) | Admitting: Family Medicine

## 2016-09-09 VITALS — BP 120/80 | HR 67 | Temp 98.4°F | Wt 197.4 lb

## 2016-09-09 DIAGNOSIS — S39012A Strain of muscle, fascia and tendon of lower back, initial encounter: Secondary | ICD-10-CM | POA: Diagnosis not present

## 2016-09-09 MED ORDER — SILDENAFIL CITRATE 20 MG PO TABS: ORAL_TABLET | ORAL | 1 refills | Status: DC

## 2016-09-09 MED ORDER — PREDNISONE 20 MG PO TABS: ORAL_TABLET | ORAL | 0 refills | Status: DC

## 2016-09-09 MED ORDER — CYCLOBENZAPRINE HCL 10 MG PO TABS: 10.0000 mg | ORAL_TABLET | Freq: Every day | ORAL | 0 refills | Status: DC

## 2016-09-09 NOTE — Patient Instructions (Signed)
Low Back Sprain  A sprain is a stretch or tear in the bands of tissue that hold bones and joints together (ligaments). Sprains of the lower back (lumbar spine) are a common cause of low back pain. A sprain occurs when ligaments are overextended or stretched beyond their limits. The ligaments can become inflamed, resulting in pain and sudden muscle tightening (spasms). A sprain can be caused by an injury (trauma), or it can develop gradually due to overuse.  There are three types of sprains:  · Grade 1 is a mild sprain involving an overstretched ligament or a very slight tear of the ligament.  · Grade 2 is a moderate sprain involving a partial tear of the ligament.  · Grade 3 is a severe sprain involving a complete tear of the ligament.    What are the causes?  This condition may be caused by:  · Trauma, such as a fall or a hit to the body.  · Twisting or overstretching the back. This may result from doing activities that require a lot of energy, such as lifting heavy objects.    What increases the risk?  The following factors may increase your risk of getting this condition:  · Playing contact sports.  · Participating in sports or activities that put excessive stress on the back and require a lot of bending and twisting, including:  ? Lifting weights or heavy objects.  ? Gymnastics.  ? Soccer.  ? Figure skating.  ? Snowboarding.  · Being overweight or obese.  · Having poor strength and flexibility.    What are the signs or symptoms?  Symptoms of this condition may include:  · Sharp or dull pain in the lower back that does not go away. Pain may extend to the buttocks.  · Stiffness.  · Limited range of motion.  · Inability to stand up straight due to stiffness or pain.  · Muscle spasms.    How is this diagnosed?    This condition may be diagnosed based on:  · Your symptoms.  · Your medical history.  · A physical exam.  ? Your health care provider may push on certain areas of your back to determine the source of your  pain.  ? You may be asked to bend forward, backward, and side to side to assess the severity of your pain and your range of motion.  · Imaging tests, such as:  ? X-rays.  ? MRI.    How is this treated?  Treatment for this condition may include:  · Applying heat and cold to the affected area.  · Medicines to help relieve pain and to relax your muscles (muscle relaxants).  · NSAIDs to help reduce swelling and discomfort.  · Physical therapy.    When your symptoms improve, it is important to gradually return to your normal routine as soon as possible to reduce pain, avoid stiffness, and avoid loss of muscle strength. Generally, symptoms should improve within 6 weeks of treatment. However, recovery time varies.  Follow these instructions at home:  Managing pain, stiffness, and swelling  · If directed, apply ice to the injured area during the first 24 hours after your injury.  ? Put ice in a plastic bag.  ? Place a towel between your skin and the bag.  ? Leave the ice on for 20 minutes, 2-3 times a day.  · If directed, apply heat to the affected area as often as told by your health care provider. Use the   heat source that your health care provider recommends, such as a moist heat pack or a heating pad.  ? Place a towel between your skin and the heat source.  ? Leave the heat on for 20-30 minutes.  ? Remove the heat if your skin turns bright red. This is especially important if you are unable to feel pain, heat, or cold. You may have a greater risk of getting burned.  Activity  · Rest and return to your normal activities as told by your health care provider. Ask your health care provider what activities are safe for you.  · Avoid activities that take a lot of effort (are strenuous) for as long as told by your health care provider.  · Do exercises as told by your health care provider.  General instructions    · Take over-the-counter and prescription medicines only as told by your health care provider.  · If you have  questions or concerns about safety while taking pain medicine, talk with your health care provider.  · Do not drive or operate heavy machinery until you know how your pain medicine affects you.  · Do not use any tobacco products, such as cigarettes, chewing tobacco, and e-cigarettes. Tobacco can delay bone healing. If you need help quitting, ask your health care provider.  · Keep all follow-up visits as told by your health care provider. This is important.  How is this prevented?  · Warm up and stretch before being active.  · Cool down and stretch after being active.  · Give your body time to rest between periods of activity.  · Avoid:  ? Being physically inactive for long periods at a time.  ? Exercising or playing sports when you are tired or in pain.  · Use correct form when playing sports and lifting heavy objects.  · Use good posture when sitting and standing.  · Maintain a healthy weight.  · Sleep on a mattress with medium firmness to support your back.  · Make sure to use equipment that fits you, including shoes that fit well.  · Be safe and responsible while being active to avoid falls.  · Do at least 150 minutes of moderate-intensity exercise each week, such as brisk walking or water aerobics. Try a form of exercise that takes stress off your back, such as swimming or stationary cycling.  · Maintain physical fitness, including:  ? Strength. In particular, develop and maintain strong abdominal muscles.  ? Flexibility.  ? Cardiovascular fitness.  ? Endurance.  Contact a health care provider if:  · Your back pain does not improve after 6 weeks of treatment.  · Your symptoms get worse.  Get help right away if:  · Your back pain is severe.  · You are unable to stand or walk.  · You develop pain in your legs.  · You develop weakness in your buttocks or legs.  · You have difficulty controlling when you urinate or when you have a bowel movement.  This information is not intended to replace advice given to you by  your health care provider. Make sure you discuss any questions you have with your health care provider.  Document Released: 03/18/2005 Document Revised: 11/23/2015 Document Reviewed: 12/28/2014  Elsevier Interactive Patient Education © 2018 [employer]

## 2016-09-09 NOTE — Progress Notes (Signed)
Subjective:     Patient ID: Paul Bird, male   DOB: 08-17-1960, 56 y.o.   MRN: [id number]  HPI Patient seen with low back pain. He states yesterday he was putting a bed together and turned and twisted felt sharp pain in his right lumbar region near the midline. No radiculitis symptoms. Tylenol did not help much improvement. He had leftover meloxicam which did not seem to help. He has some increased stiffness today and difficulty with full extension. Denies any loss of urine or stool control. No fever.  Pain is worse with back flexion and changing positions. Pain is moderate.  Past Medical History:  Diagnosis Date  . Actinic keratosis 04/19/2009   Qualifier: Diagnosis of  By: Joyce Gross    . DYSESTHESIA 09/08/2009   Qualifier: Diagnosis of  By: Elease Hashimoto MD, Derrin Currey    . Erectile dysfunction   . 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 ROTATOR CUFF REPAIR  2001   right    reports that he has never smoked. He has never used smokeless tobacco. He reports that he drinks alcohol. He reports that he does not use drugs. family history includes Heart disease (age of onset: 55) in his father. No Known Allergies   Review of Systems  Constitutional: Negative for activity change, appetite change and fever.  Respiratory: Negative for cough and shortness of breath.   Cardiovascular: Negative for chest pain and leg swelling.  Gastrointestinal: Negative for abdominal pain and vomiting.  Genitourinary: Negative for dysuria, flank pain and hematuria.  Musculoskeletal: Positive for back pain. Negative for joint swelling.  Neurological: Negative for weakness and numbness.       Objective:   Physical Exam  Constitutional: He is oriented to person, place, and time. He appears well-developed and well-nourished. No distress.  Neck: No thyromegaly present.  Cardiovascular: Normal rate, regular  rhythm and normal heart sounds.   No murmur heard. Pulmonary/Chest: Effort normal and breath sounds normal. No respiratory distress. He has no wheezes. He has no rales.  Musculoskeletal: He exhibits no edema.  Neurological: He is alert and oriented to person, place, and time. He has normal reflexes. No cranial nerve deficit.  Skin: No rash noted.       Assessment:     Lower lumbar back pain. Suspect acute strain    Plan:     -Try alternating heat and ice. - reviewed some appropriate stretches -Prednisone 20 mg 2 tablets daily for 5 days -Flexeril 10 mg daily at bedtime -Follow-up immediately for any weakness, numbness, or progressive pain  [name] MD [facility]

## 2017-04-01 HISTORY — PX: OTHER SURGICAL HISTORY: SHX169

## 2017-07-19 ENCOUNTER — Ambulatory Visit: Payer: Managed Care, Other (non HMO) | Admitting: Family Medicine

## 2017-07-19 NOTE — Progress Notes (Deleted)
Paul Bird - 57 y.o. male MRN [id number]  Date of birth: November 21, 1960  SUBJECTIVE:  Including CC & ROS.  No chief complaint on file.   Paul Bird is a 57 y.o. male that is  ***.  ***   Review of Systems  HISTORY: Past Medical, Surgical, Social, and Family History Reviewed & Updated per EMR.   Pertinent Historical Findings include:  Past Medical History:  Diagnosis Date  . Actinic keratosis 04/19/2009   Qualifier: Diagnosis of  By: Joyce Gross    . DYSESTHESIA 09/08/2009   Qualifier: Diagnosis of  By: Elease Hashimoto MD, Bruce    . Erectile dysfunction   . 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 ROTATOR CUFF REPAIR  2001   right    No Known Allergies  Family History  Problem Relation Age of Onset  . Heart disease Father 38       ?viral myocarditis  . Colon cancer Neg Hx   . Colon polyps Neg Hx   . Rectal cancer Neg Hx   . Ulcerative colitis Neg Hx   . Stomach cancer Neg Hx      Social History   Socioeconomic History  . Marital status: Divorced    Spouse name: Not on file  . Number of children: Not on file  . Years of education: Not on file  . Highest education level: Not on file  Occupational History  . Not on file  Social Needs  . Financial resource strain: Not on file  . Food insecurity:    Worry: Not on file    Inability: Not on file  . Transportation needs:    Medical: Not on file    Non-medical: Not on file  Tobacco Use  . Smoking status: Never Smoker  . Smokeless tobacco: Never Used  Substance and Sexual Activity  . Alcohol use: Yes    Alcohol/week: 0.0 oz    Comment: once every 1-2 weeks  . Drug use: No  . Sexual activity: Not on file  Lifestyle  . Physical activity:    Days per week: Not on file    Minutes per session: Not on file  . Stress: Not on file  Relationships  . Social connections:    Talks on phone: Not on file   Gets together: Not on file    Attends religious service: Not on file    Active member of club or organization: Not on file    Attends meetings of clubs or organizations: Not on file    Relationship status: Not on file  . Intimate partner violence:    Fear of current or ex partner: Not on file    Emotionally abused: Not on file    Physically abused: Not on file    Forced sexual activity: Not on file  Other Topics Concern  . Not on file  Social History Narrative  . Not on file     PHYSICAL EXAM:  VS: There were no vitals taken for this visit. Physical Exam Gen: NAD, alert, cooperative with exam, well-appearing ENT: normal lips, normal nasal mucosa,  Eye: normal EOM, normal conjunctiva and lids CV:  no edema, +2 pedal pulses   Resp: no accessory muscle use, non-labored,  GI: no masses or tenderness, no hernia  Skin: no rashes, no areas of induration  Neuro: normal tone, normal sensation to touch Psych:  normal insight, alert and oriented MSK:  ***      ASSESSMENT & PLAN:   No problem-specific Assessment & Plan notes found for this encounter.

## 2017-08-20 ENCOUNTER — Encounter: Payer: Self-pay | Admitting: Internal Medicine

## 2017-10-24 ENCOUNTER — Ambulatory Visit (INDEPENDENT_AMBULATORY_CARE_PROVIDER_SITE_OTHER): Payer: Managed Care, Other (non HMO) | Admitting: Family Medicine

## 2017-10-24 ENCOUNTER — Encounter: Payer: Self-pay | Admitting: Family Medicine

## 2017-10-24 VITALS — BP 120/76 | HR 76 | Temp 98.1°F | Ht 73.0 in | Wt 193.4 lb

## 2017-10-24 DIAGNOSIS — H00012 Hordeolum externum right lower eyelid: Secondary | ICD-10-CM | POA: Diagnosis not present

## 2017-10-24 NOTE — Patient Instructions (Signed)

## 2017-10-24 NOTE — Progress Notes (Signed)
  Subjective:     Patient ID: Paul Bird, male   DOB: 1961/01/24, 57 y.o.   MRN: [id number]  HPI Patient seen with small stye right lower lid for about one week. No significant symptoms otherwise. No eye drainage. No visual difficulties. He has not tried any warm compresses.  No significant pain.  Past Medical History:  Diagnosis Date  . Actinic keratosis 04/19/2009   Qualifier: Diagnosis of  By: Joyce Gross    . DYSESTHESIA 09/08/2009   Qualifier: Diagnosis of  By: Elease Hashimoto MD, Bruce    . Erectile dysfunction   . 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 ROTATOR CUFF REPAIR  2001   right    reports that he has never smoked. He has never used smokeless tobacco. He reports that he drinks alcohol. He reports that he does not use drugs. family history includes Heart disease (age of onset: 57) in his father. No Known Allergies   Review of Systems  Constitutional: Negative for chills and fever.  Eyes: Negative for visual disturbance.       Objective:   Physical Exam  Constitutional: He appears well-developed and well-nourished.  Eyes:  Patient has small hordeolum right lower lid. Conjunctiva appears normal.       Assessment:     Small benign-appearing hordeolum right lower lid    Plan:     -Warm compresses several times daily -Explained these are usually benign and self-limiting -Touch base in 2 weeks if not resolving  [name] MD [facility]

## 2017-11-17 ENCOUNTER — Ambulatory Visit (INDEPENDENT_AMBULATORY_CARE_PROVIDER_SITE_OTHER): Payer: Managed Care, Other (non HMO) | Admitting: Family Medicine

## 2017-11-17 ENCOUNTER — Encounter: Payer: Self-pay | Admitting: Family Medicine

## 2017-11-17 VITALS — BP 120/70 | HR 64 | Temp 98.3°F | Ht 73.0 in | Wt 194.0 lb

## 2017-11-17 DIAGNOSIS — Z0001 Encounter for general adult medical examination with abnormal findings: Secondary | ICD-10-CM

## 2017-11-17 DIAGNOSIS — C439 Malignant melanoma of skin, unspecified: Secondary | ICD-10-CM

## 2017-11-17 DIAGNOSIS — Z0000 Encounter for general adult medical examination without abnormal findings: Secondary | ICD-10-CM

## 2017-11-17 DIAGNOSIS — C4359 Malignant melanoma of other part of trunk: Secondary | ICD-10-CM | POA: Diagnosis not present

## 2017-11-17 DIAGNOSIS — L819 Disorder of pigmentation, unspecified: Secondary | ICD-10-CM

## 2017-11-17 DIAGNOSIS — Z125 Encounter for screening for malignant neoplasm of prostate: Secondary | ICD-10-CM

## 2017-11-17 LAB — BASIC METABOLIC PANEL
BUN: 16 mg/dL (ref 6–23)
CHLORIDE: 104 meq/L (ref 96–112)
CO2: 31 meq/L (ref 19–32)
Calcium: 9.6 mg/dL (ref 8.4–10.5)
Creatinine, Ser: 1.12 mg/dL (ref 0.40–1.50)
GFR: 71.88 mL/min (ref >60.00)
Glucose, Bld: 104 mg/dL — ABNORMAL HIGH (ref 70–99)
Potassium: 4.6 mEq/L (ref 3.5–5.1)
SODIUM: 140 meq/L (ref 135–145)

## 2017-11-17 LAB — CBC WITH DIFFERENTIAL/PLATELET
Basophils Absolute: 0.1 10*3/uL (ref 0.0–0.1)
Basophils Relative: 0.7 % (ref 0.0–3.0)
Eosinophils Absolute: 0.4 10*3/uL (ref 0.0–0.7)
Eosinophils Relative: 5.4 % — ABNORMAL HIGH (ref 0.0–5.0)
HCT: 43.1 % (ref 39.0–52.0)
Hemoglobin: 14.5 g/dL (ref 13.0–17.0)
Lymphocytes Relative: 31.7 % (ref 12.0–46.0)
Lymphs Abs: 2.1 10*3/uL (ref 0.7–4.0)
MCHC: 33.7 g/dL (ref 30.0–36.0)
MCV: 87.6 fl (ref 78.0–100.0)
Monocytes Absolute: 0.5 10*3/uL (ref 0.1–1.0)
Monocytes Relative: 7 % (ref 3.0–12.0)
NEUTROS ABS: 3.7 10*3/uL (ref 1.4–7.7)
NEUTROS PCT: 55.2 % (ref 43.0–77.0)
PLATELETS: 246 10*3/uL (ref 150.0–400.0)
RBC: 4.91 Mil/uL (ref 4.22–5.81)
RDW: 14.1 % (ref 11.5–15.5)
WBC: 6.8 10*3/uL (ref 4.0–10.5)

## 2017-11-17 LAB — HEPATIC FUNCTION PANEL
ALT: 14 U/L (ref 0–53)
AST: 17 U/L (ref 0–37)
Albumin: 4.2 g/dL (ref 3.5–5.2)
Alkaline Phosphatase: 84 U/L (ref 39–117)
BILIRUBIN DIRECT: 0.1 mg/dL (ref 0.0–0.3)
TOTAL PROTEIN: 7.3 g/dL (ref 6.0–8.3)
Total Bilirubin: 0.5 mg/dL (ref 0.2–1.2)

## 2017-11-17 LAB — LIPID PANEL
CHOL/HDL RATIO: 4
Cholesterol: 200 mg/dL (ref 0–200)
HDL: 54.2 mg/dL (ref >39.00)
LDL Cholesterol: 135 mg/dL — ABNORMAL HIGH (ref 0–99)
NonHDL: 146.23
TRIGLYCERIDES: 57 mg/dL (ref 0.0–149.0)
VLDL: 11.4 mg/dL (ref 0.0–40.0)

## 2017-11-17 LAB — PSA: PSA: 1.85 ng/mL (ref 0.10–4.00)

## 2017-11-17 LAB — TSH: TSH: 0.61 u[IU]/mL (ref 0.35–4.50)

## 2017-11-17 MED ORDER — SILDENAFIL CITRATE 20 MG PO TABS
ORAL_TABLET | ORAL | 1 refills | Status: DC
Start: 2017-11-17 — End: 2021-09-14

## 2017-11-17 NOTE — Patient Instructions (Addendum)
Set up repeat colonoscopy.   We will call you with lab results.  Keep wound dry for the first 24 hours then clean daily with soap and water for one week. Apply topical vaseline daily for 3-4 days. Keep covered with clean dressing for 4-5 days. Follow up promptly for any signs of infection such as redness, warmth, pain, or drainage.

## 2017-11-17 NOTE — Progress Notes (Addendum)
Subjective:     Patient ID: Paul Bird, male   DOB: Oct 08, 1960, 57 y.o.   MRN: [id number]  HPI Patient is seen for physical exam. Generally very healthy. He exercises fairly regularly. Nonsmoker. Takes no regular medications. He's had some ongoing shoulder difficulties.  Tetanus up-to-date. He declines flu vaccine. He does need repeat colonoscopy. No history of hepatitis C screening. He is low risk.  Past Medical History:  Diagnosis Date  . Actinic keratosis 04/19/2009   Qualifier: Diagnosis of  By: Joyce Gross    . DYSESTHESIA 09/08/2009   Qualifier: Diagnosis of  By: Elease Hashimoto MD, Hema Lanza    . Erectile dysfunction   . 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 ROTATOR CUFF REPAIR  2001   right    reports that he has never smoked. He has never used smokeless tobacco. He reports that he drinks alcohol. He reports that he does not use drugs. family history includes Heart disease (age of onset: 39) in his father. No Known Allergies   Review of Systems  Constitutional: Negative for activity change, appetite change, fatigue and fever.  HENT: Negative for congestion, ear pain and trouble swallowing.   Eyes: Negative for pain and visual disturbance.  Respiratory: Negative for cough, shortness of breath and wheezing.   Cardiovascular: Negative for chest pain and palpitations.  Gastrointestinal: Negative for abdominal distention, abdominal pain, blood in stool, constipation, diarrhea, nausea, rectal pain and vomiting.  Genitourinary: Negative for dysuria, hematuria and testicular pain.  Musculoskeletal: Negative for arthralgias and joint swelling.  Skin: Negative for rash.  Neurological: Negative for dizziness, syncope and headaches.  Hematological: Negative for adenopathy.  Psychiatric/Behavioral: Negative for confusion and dysphoric mood.       Objective:   Physical Exam   Constitutional: He is oriented to person, place, and time. He appears well-developed and well-nourished. No distress.  HENT:  Head: Normocephalic and atraumatic.  Right Ear: External ear normal.  Left Ear: External ear normal.  Mouth/Throat: Oropharynx is clear and moist.  Eyes: Pupils are equal, round, and reactive to light. Conjunctivae and EOM are normal.  Neck: Normal range of motion. Neck supple. No thyromegaly present.  Cardiovascular: Normal rate, regular rhythm and normal heart sounds.  No murmur heard. Pulmonary/Chest: No respiratory distress. He has no wheezes. He has no rales.  Abdominal: Soft. Bowel sounds are normal. He exhibits no distension and no mass. There is no tenderness. There is no rebound and no guarding.  Musculoskeletal: He exhibits no edema.  Lymphadenopathy:    He has no cervical adenopathy.  Neurological: He is alert and oriented to person, place, and time. He displays normal reflexes. No cranial nerve deficit.  Skin: No rash noted.  Dark pigmented lesion upper back just right of the midline upper thoracic region. Some color variegation. Fairly well demarcated border and fairly symmetric. Minimally raised About 6 mm diameter  Psychiatric: He has a normal mood and affect.       Assessment:     Physical exam. Generally healthy 57 year old male. He does have slightly atypical pigmented lesion upper back.    Plan:     -Discussed risks and benefits of shave excision including risks of bleeding, bruising, scar formation, and infection and patient consented to proceed.  Skin prepped with betadine and alcohol.  Anesthesia with 1% Xylocaine with epinephrine. Shave excision of lesion with #15 blade with minimal bleeding.  Patient tolerated well.  Vaseline and dressing applied. -Obtain screening lab work including PSA. We'll also include hepatitis C antibody -Patient is encouraged to set up repeat colonoscopy  [name] MD [facility]  [facility] came back significant for melanoma Clark 1 with depth 0.2 mm. Patient has been notified. He is going on vacation next week. We have put in referral to skin surgery [facility] and will try to get him as soon as possible  [name] MD [facility]

## 2017-11-18 LAB — HEPATITIS C ANTIBODY
Hepatitis C Ab: NONREACTIVE
SIGNAL TO CUT-OFF: 0.02 (ref <1.00)

## 2017-11-19 ENCOUNTER — Telehealth: Payer: Self-pay | Admitting: *Deleted

## 2017-11-19 DIAGNOSIS — C439 Malignant melanoma of skin, unspecified: Secondary | ICD-10-CM | POA: Insufficient documentation

## 2017-11-19 HISTORY — DX: Malignant melanoma of skin, unspecified: C43.9

## 2017-11-19 NOTE — Telephone Encounter (Signed)
Pt notified   Referral has been made to skin surgery center

## 2017-11-19 NOTE — Telephone Encounter (Signed)
CRITICAL VALUE STICKER  CRITICAL VALUE:  Derm path of upper back Malignant melanoma Clark level 1  Depth 0.96mm Peripheral margins focally involved by melanoma in situ  RECEIVER (on-site recipient of call): Dorrene German, RN  [place] NOTIFIED: 11/19/17 2:12 PM  MESSENGER (representative from lab): Colletta Maryland from [facility]  MD NOTIFIED: Carolann Littler  TIME OF NOTIFICATION: 2:16 PM  RESPONSE: PCP will contact patient

## 2017-11-19 NOTE — Addendum Note (Signed)
Addended by: [name] on: 11/19/2017 03:37 PM   Modules accepted: Orders

## 2017-12-03 ENCOUNTER — Encounter: Payer: Managed Care, Other (non HMO) | Admitting: Family Medicine

## 2018-01-19 ENCOUNTER — Encounter: Payer: Self-pay | Admitting: Internal Medicine

## 2018-02-17 ENCOUNTER — Ambulatory Visit (AMBULATORY_SURGERY_CENTER): Payer: Self-pay | Admitting: *Deleted

## 2018-02-17 ENCOUNTER — Encounter: Payer: Self-pay | Admitting: Internal Medicine

## 2018-02-17 VITALS — Ht 73.0 in | Wt 200.0 lb

## 2018-02-17 DIAGNOSIS — Z86010 Personal history of colonic polyps: Secondary | ICD-10-CM

## 2018-02-17 MED ORDER — NA SULFATE-K SULFATE-MG SULF 17.5-3.13-1.6 GM/177ML PO SOLN: 1.0000 | Freq: Once | ORAL | 0 refills | Status: AC

## 2018-02-17 NOTE — Progress Notes (Signed)
No egg or soy allergy known to patient  No issues with past sedation with any surgeries  or procedures, no intubation problems  No diet pills per patient No home 02 use per patient  No blood thinners per patient  Pt denies issues with constipation  No A fib or A flutter  EMMI video sent to pt's e mail -- pt declined  Suprep $15 coupon to pt

## 2018-03-02 ENCOUNTER — Telehealth: Payer: Self-pay | Admitting: Family Medicine

## 2018-03-02 NOTE — Telephone Encounter (Signed)
Pt dropped off a [facility] PCP Biometric Screening Form to be completed by provider.  Upon completion of form pt would like for it to be faxed to [phone] to [place] was placed in providers folder.

## 2018-03-02 NOTE — Telephone Encounter (Signed)
Forms placed in red folder on your desk. Please let me know once completed.

## 2018-03-03 ENCOUNTER — Telehealth: Payer: Self-pay

## 2018-03-03 NOTE — Telephone Encounter (Signed)
Faxed health paperwork to [place]. to [phone]

## 2018-03-05 ENCOUNTER — Ambulatory Visit (AMBULATORY_SURGERY_CENTER): Payer: Managed Care, Other (non HMO) | Admitting: Internal Medicine

## 2018-03-05 ENCOUNTER — Encounter: Payer: Self-pay | Admitting: Internal Medicine

## 2018-03-05 VITALS — BP 113/71 | HR 62 | Temp 98.0°F | Resp 12 | Ht 73.0 in | Wt 200.0 lb

## 2018-03-05 DIAGNOSIS — Z86010 Personal history of colonic polyps: Secondary | ICD-10-CM | POA: Diagnosis not present

## 2018-03-05 DIAGNOSIS — D123 Benign neoplasm of transverse colon: Secondary | ICD-10-CM | POA: Diagnosis not present

## 2018-03-05 MED ORDER — SODIUM CHLORIDE 0.9 % IV SOLN: 500.0000 mL | Freq: Once | INTRAVENOUS | Status: DC

## 2018-03-05 NOTE — Patient Instructions (Signed)
[place] given for polyps and hemorrhoids  YOU HAD AN ENDOSCOPIC PROCEDURE TODAY AT [facility]:   Refer to the procedure report that was given to you for any specific questions about what was found during the examination.  If the procedure report does not answer your questions, please call your gastroenterologist to clarify.  If you requested that your care partner not be given the details of your procedure findings, then the procedure report has been included in a sealed envelope for you to review at your convenience later.  YOU SHOULD EXPECT: Some feelings of bloating in the abdomen. Passage of more gas than usual.  Walking can help get rid of the air that was put into your GI tract during the procedure and reduce the bloating. If you had a lower endoscopy (such as a colonoscopy or flexible sigmoidoscopy) you may notice spotting of blood in your stool or on the toilet paper. If you underwent a bowel prep for your procedure, you may not have a normal bowel movement for a few days.  Please Note:  You might notice some irritation and congestion in your nose or some drainage.  This is from the oxygen used during your procedure.  There is no need for concern and it should clear up in a day or so.  SYMPTOMS TO REPORT IMMEDIATELY:   Following lower endoscopy (colonoscopy or flexible sigmoidoscopy):  Excessive amounts of blood in the stool  Significant tenderness or worsening of abdominal pains  Swelling of the abdomen that is new, acute  Fever of 100F or higher   For urgent or emergent issues, a gastroenterologist can be reached at any hour by calling [phone].   DIET:  We do recommend a small meal at first, but then you may proceed to your regular diet.  Drink plenty of fluids but you should avoid alcoholic beverages for 24 hours.  ACTIVITY:  You should plan to take it easy for the rest of today and you should NOT DRIVE or use heavy machinery until tomorrow (because of the  sedation medicines used during the test).    FOLLOW UP: Our staff will call the number listed on your records the next business day following your procedure to check on you and address any questions or concerns that you may have regarding the information given to you following your procedure. If we do not reach you, we will leave a message.  However, if you are feeling well and you are not experiencing any problems, there is no need to return our call.  We will assume that you have returned to your regular daily activities without incident.  If any biopsies were taken you will be contacted by phone or by letter within the next 1-3 weeks.  Please call us at [phone] if you have not heard about the biopsies in 3 weeks.    SIGNATURES/CONFIDENTIALITY: You and/or your care partner have signed paperwork which will be entered into your electronic medical record.  These signatures attest to the fact that that the information above on your After Visit Summary has been reviewed and is understood.  Full responsibility of the confidentiality of this discharge information lies with you and/or your care-partner.

## 2018-03-05 NOTE — Op Note (Signed)
[facility] Patient Name: Paul Bird Procedure Date: 03/05/2018 9:55 AM MRN: [id number] Endoscopist: Jerene Bears , MD Age: 57 Referring MD:  Date of Birth: 1960/11/20 Gender: Male Account #: [id number] Procedure:                Colonoscopy Indications:              Surveillance: Personal history of adenomatous                            polyps on last colonoscopy 3 years ago Medicines:                Monitored Anesthesia Care Procedure:                Pre-Anesthesia Assessment:                           - Prior to the procedure, a History and Physical                            was performed, and patient medications and                            allergies were reviewed. The patient's tolerance of                            previous anesthesia was also reviewed. The risks                            and benefits of the procedure and the sedation                            options and risks were discussed with the patient.                            All questions were answered, and informed consent                            was obtained. Prior Anticoagulants: The patient has                            taken no previous anticoagulant or antiplatelet                            agents. ASA Grade Assessment: II - A patient with                            mild systemic disease. After reviewing the risks                            and benefits, the patient was deemed in                            satisfactory condition to undergo the procedure.  After obtaining informed consent, the colonoscope                            was passed under direct vision. Throughout the                            procedure, the patient's blood pressure, pulse, and                            oxygen saturations were monitored continuously. The                            Colonoscope was introduced through the anus and                            advanced to the cecum,  identified by appendiceal                            orifice and ileocecal valve. The colonoscopy was                            performed without difficulty. The patient tolerated                            the procedure well. The quality of the bowel                            preparation was excellent. The ileocecal valve,                            appendiceal orifice, and rectum were photographed. Scope In: 10:13:00 AM Scope Out: 10:25:52 AM Scope Withdrawal Time: 0 hours 10 minutes 55 seconds  Total Procedure Duration: 0 hours 12 minutes 52 seconds  Findings:                 The digital rectal exam was normal.                           A 6 mm polyp was found in the transverse colon. The                            polyp was sessile. The polyp was removed with a                            cold snare. Resection and retrieval were complete.                           Internal hemorrhoids were found during                            retroflexion. The hemorrhoids were small.                           The exam was otherwise without abnormality. Complications:  No immediate complications. Estimated Blood Loss:     Estimated blood loss was minimal. Impression:               - One 6 mm polyp in the transverse colon, removed                            with a cold snare. Resected and retrieved.                           - Small internal hemorrhoids.                           - The examination was otherwise normal. Recommendation:           - Patient has a contact number available for                            emergencies. The signs and symptoms of potential                            delayed complications were discussed with the                            patient. Return to normal activities tomorrow.                            Written discharge instructions were provided to the                            patient.                           - Resume previous diet.                            - Continue present medications.                           - Await pathology results.                           - Repeat colonoscopy in 5 years for surveillance. Jerene Bears, MD 03/05/2018 10:28:05 AM This report has been signed electronically.

## 2018-03-05 NOTE — Progress Notes (Signed)
To PACU, VSS. Report to [email or web address] 

## 2018-03-05 NOTE — Progress Notes (Signed)
Called to room to assist during endoscopic procedure.  Patient ID and intended procedure confirmed with present staff. Received instructions for my participation in the procedure from the performing physician.  

## 2018-03-05 NOTE — Progress Notes (Signed)
Pt's states no medical or surgical changes since previsit or office visit. 

## 2018-03-06 ENCOUNTER — Telehealth: Payer: Self-pay

## 2018-03-06 NOTE — Telephone Encounter (Signed)
  Follow up Call-  Call back number 03/05/2018  Post procedure Call Back phone  # [phone]  Permission to leave phone message Yes  Some recent data might be hidden     Patient questions:  Do you have a fever, pain , or abdominal swelling? No. Pain Score  0 *  Have you tolerated food without any problems? Yes.    Have you been able to return to your normal activities? Yes.    Do you have any questions about your discharge instructions: Diet   No. Medications  No. Follow up visit  No.  Do you have questions or concerns about your Care? No.  Actions: * If pain score is 4 or above: No action needed, pain <4.

## 2018-03-11 ENCOUNTER — Encounter: Payer: Self-pay | Admitting: Internal Medicine

## 2018-07-03 NOTE — Telephone Encounter (Signed)
Per Megan's new msg on 03/03/2018 she faxed the form to [place] [phone].  So nothing further is needed.

## 2018-09-29 ENCOUNTER — Other Ambulatory Visit: Payer: Self-pay

## 2018-09-29 ENCOUNTER — Other Ambulatory Visit: Payer: Managed Care, Other (non HMO)

## 2018-09-29 DIAGNOSIS — Z20822 Contact with and (suspected) exposure to covid-19: Secondary | ICD-10-CM

## 2018-10-05 ENCOUNTER — Telehealth: Payer: Self-pay | Admitting: *Deleted

## 2018-10-05 LAB — NOVEL CORONAVIRUS, NAA: SARS-CoV-2, NAA: DETECTED — AB

## 2018-10-05 NOTE — Telephone Encounter (Signed)
CALL REPORT--Paul Bird, triage nurse from the [facility] called to let Dr Elease Hashimoto know the pts COVID test was positive.  Message forwarded to Dr Elease Hashimoto and she stated she will send a results note also.

## 2018-10-05 NOTE — Telephone Encounter (Signed)
I called pt.  He is doing well.  No fever for several days and basically asymptomatic now for several days.  He exercised past couple of days without difficulty.  He has quarantined from work and ready to go back soon.   He will call or follow-up for any dyspnea or other symptoms but at this point seems to be recovering uneventfully

## 2018-10-12 ENCOUNTER — Other Ambulatory Visit: Payer: Self-pay

## 2018-10-12 MED ORDER — ALBUTEROL SULFATE HFA 108 (90 BASE) MCG/ACT IN AERS: 2.0000 | INHALATION_SPRAY | RESPIRATORY_TRACT | 0 refills | Status: DC | PRN

## 2018-10-12 NOTE — Telephone Encounter (Signed)
Needs to stay quarantined until at least 3 days after symptoms fully resolved and ER is increased/severe dyspnea.  Otherwise, plenty of fluids and rest.

## 2018-10-12 NOTE — Telephone Encounter (Signed)
Called patient and gave him the message. I have sent the Proair to the [facility] in [facility]. Patient verbalized an understanding.

## 2018-10-12 NOTE — Telephone Encounter (Signed)
There is some concern and evidence that prednisone may worsen Covid outcomes- so I would not recommend oral prednisone at this time.  If he feels he is wheezing we could try Proair MDI 2 puffs every 4 to 6 hours as needed.  Recommend ER evaluation if dyspnea progresses.

## 2018-10-12 NOTE — Telephone Encounter (Signed)
Please see message.  Please advise. 

## 2018-10-12 NOTE — Telephone Encounter (Signed)
Called patient and he stated that he is definitely having chest tightness and some SOB with increased activity. Patient has had some vomiting and diarrhea. Patient is asking for a Prednisone dose pack for this if OK? He wants [facility] sent to [facility] in [facility].

## 2018-10-12 NOTE — Telephone Encounter (Signed)
Pt calling.  States that he has started to run a fever this morning.  States he did call in at 6:30 and report, but I'm not seeing anything regarding that in Epic.  Pt would like a call back to let him know what do do.

## 2019-10-08 ENCOUNTER — Ambulatory Visit (INDEPENDENT_AMBULATORY_CARE_PROVIDER_SITE_OTHER): Payer: No Typology Code available for payment source | Admitting: Family Medicine

## 2019-10-08 ENCOUNTER — Other Ambulatory Visit: Payer: Self-pay

## 2019-10-08 ENCOUNTER — Encounter: Payer: Self-pay | Admitting: Counselor

## 2019-10-08 ENCOUNTER — Encounter: Payer: Self-pay | Admitting: Family Medicine

## 2019-10-08 VITALS — BP 140/68 | HR 71 | Temp 97.9°F | Ht 73.0 in | Wt 194.2 lb

## 2019-10-08 DIAGNOSIS — Z0000 Encounter for general adult medical examination without abnormal findings: Secondary | ICD-10-CM

## 2019-10-08 DIAGNOSIS — R413 Other amnesia: Secondary | ICD-10-CM

## 2019-10-08 NOTE — Patient Instructions (Signed)
Preventive Care 59-59 Years Old, Male Preventive care refers to lifestyle choices and visits with your health care provider that can promote health and wellness. This includes:  A yearly physical exam. This is also called an annual well check.  Regular dental and eye exams.  Immunizations.  Screening for certain conditions.  Healthy lifestyle choices, such as eating a healthy diet, getting regular exercise, not using drugs or products that contain nicotine and tobacco, and limiting alcohol use. What can I expect for my preventive care visit? Physical exam Your health care provider will check:  Height and weight. These may be used to calculate body mass index (BMI), which is a measurement that tells if you are at a healthy weight.  Heart rate and blood pressure.  Your skin for abnormal spots. Counseling Your health care provider may ask you questions about:  Alcohol, tobacco, and drug use.  Emotional well-being.  Home and relationship well-being.  Sexual activity.  Eating habits.  Work and work Statistician. What immunizations do I need?  Influenza (flu) vaccine  This is recommended every year. Tetanus, diphtheria, and pertussis (Tdap) vaccine  You may need a Td booster every 10 years. Varicella (chickenpox) vaccine  You may need this vaccine if you have not already been vaccinated. Zoster (shingles) vaccine  You may need this after age 64. Measles, mumps, and rubella (MMR) vaccine  You may need at least one dose of MMR if you were born in 1957 or later. You may also need a second dose. Pneumococcal conjugate (PCV13) vaccine  You may need this if you have certain conditions and were not previously vaccinated. Pneumococcal polysaccharide (PPSV23) vaccine  You may need one or two doses if you smoke cigarettes or if you have certain conditions. Meningococcal conjugate (MenACWY) vaccine  You may need this if you have certain conditions. Hepatitis A  vaccine  You may need this if you have certain conditions or if you travel or work in places where you may be exposed to hepatitis A. Hepatitis B vaccine  You may need this if you have certain conditions or if you travel or work in places where you may be exposed to hepatitis B. Haemophilus influenzae type b (Hib) vaccine  You may need this if you have certain risk factors. Human papillomavirus (HPV) vaccine  If recommended by your health care provider, you may need three doses over 6 months. You may receive vaccines as individual doses or as more than one vaccine together in one shot (combination vaccines). Talk with your health care provider about the risks and benefits of combination vaccines. What tests do I need? Blood tests  Lipid and cholesterol levels. These may be checked every 5 years, or more frequently if you are over 59 years old.  Hepatitis C test.  Hepatitis B test. Screening  Lung cancer screening. You may have this screening every year starting at age 59 if you have a 30-pack-year history of smoking and currently smoke or have quit within the past 15 years.  Prostate cancer screening. Recommendations will vary depending on your family history and other risks.  Colorectal cancer screening. All adults should have this screening starting at age 59 and continuing until age 2. Your health care provider may recommend screening at age 59 if you are at increased risk. You will have tests every 1-10 years, depending on your results and the type of screening test.  Diabetes screening. This is done by checking your blood sugar (glucose) after you have not eaten  for a while (fasting). You may have this done every 1-3 years.  Sexually transmitted disease (STD) testing. Follow these instructions at home: Eating and drinking  Eat a diet that includes fresh fruits and vegetables, whole grains, lean protein, and low-fat dairy products.  Take vitamin and mineral supplements as  recommended by your health care provider.  Do not drink alcohol if your health care provider tells you not to drink.  If you drink alcohol: ? Limit how much you have to 0-2 drinks a day. ? Be aware of how much alcohol is in your drink. In the [place] one drink equals one 12 oz bottle of beer (355 mL), one 5 oz glass of wine (148 mL), or one 1 oz glass of hard liquor (44 mL). Lifestyle  Take daily care of your teeth and gums.  Stay active. Exercise for at least 30 minutes on 5 or more days each week.  Do not use any products that contain nicotine or tobacco, such as cigarettes, e-cigarettes, and chewing tobacco. If you need help quitting, ask your health care provider.  If you are sexually active, practice safe sex. Use a condom or other form of protection to prevent STIs (sexually transmitted infections).  Talk with your health care provider about taking a low-dose aspirin every day starting at age 59. What's next?  Go to your health care provider once a year for a well check visit.  Ask your health care provider how often you should have your eyes and teeth checked.  Stay up to date on all vaccines. This information is not intended to replace advice given to you by your health care provider. Make sure you discuss any questions you have with your health care provider. Document Revised: 03/12/2018 Document Reviewed: 03/12/2018 Elsevier Patient Education  [employer].  I will set up neuropsychological assessment

## 2019-10-08 NOTE — Progress Notes (Signed)
Established Patient Office Visit  Subjective:  Patient ID: Paul Bird, male    DOB: 1961/03/06  Age: 59 y.o. MRN: [id number]  CC:  Chief Complaint  Patient presents with  . Annual Exam    wants to discuss memory issues     HPI Paul Bird presents for physical exam.  He had lentigo maligna type melanoma that was diagnosed when he was seen by dermatology little over a year ago. Initial bx was here.   He plans to see them yearly for checkups.  His major concern is regarding memory.  He states his mother had dementia diagnosed around age 51.  She may have had some issues prior to that time.  He has had a very stressful year during the past year working 7 days/week.  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  His wife has had some concerns that he sometimes repeats himself.  He has occasionally had episodes where he was driving and could not recall how to get from one place to another this has been of gradual onset.  Sometimes misplaces things and cannot remember names well.  Health maintenance reviewed.  Colonoscopy up-to-date.  Declined shingles vaccine.  Declines Covid vaccine.  Family history reviewed.  Mother had reported Alzheimer's dementia.  He has a brother age 65 with no major health concerns.  Father had reported viral myocarditis around age 58.  Past Medical History:  Diagnosis Date  . Actinic keratosis 04/19/2009   Qualifier: Diagnosis of  By: Joyce Gross    . Adenomatous polyp   . Cancer [facility])    melanoma -- neck, chest   . DYSESTHESIA 09/08/2009   Qualifier: Diagnosis of  By: Elease Hashimoto MD, Zaydenn Balaguer    . Erectile dysfunction   . 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 COLONOSCOPY    . melanoma removal  2019   neck and chest   . NECK SURGERY    . POLYPECTOMY    . ROTATOR CUFF REPAIR  2001   right     Family History  Problem Relation Age of Onset  . Heart disease Father 32       ?viral myocarditis  . Colon cancer Neg Hx   . Colon polyps Neg Hx   . Rectal cancer Neg Hx   . Ulcerative colitis Neg Hx   . Stomach cancer Neg Hx   . Esophageal cancer Neg Hx     Social History   Socioeconomic History  . Marital status: Divorced    Spouse name: Not on file  . Number of children: Not on file  . Years of education: Not on file  . Highest education level: Not on file  Occupational History  . Not on file  Tobacco Use  . Smoking status: Never Smoker  . Smokeless tobacco: Never Used  Substance and Sexual Activity  . Alcohol use: Yes    Comment: once every 1-2 weeks  . Drug use: No  . Sexual activity: Not on file  Other Topics Concern  . Not on file  Social History Narrative  . Not on file   Social Determinants of Health   Financial Resource Strain:   . Difficulty of Paying Living Expenses:   Food Insecurity:   . Worried About Charity fundraiser in the Last Year:   .  Ran Out of Food in the Last Year:   Transportation Needs:   . Film/video editor (Medical):   Marland Kitchen Lack of Transportation (Non-Medical):   Physical Activity:   . Days of Exercise per Week:   . Minutes of Exercise per Session:   Stress:   . Feeling of Stress :   Social Connections:   . Frequency of Communication with Friends and Family:   . Frequency of Social Gatherings with Friends and Family:   . Attends Religious Services:   . Active Member of Clubs or Organizations:   . Attends Archivist Meetings:   Marland Kitchen Marital Status:   Intimate Partner Violence:   . Fear of Current or Ex-Partner:   . Emotionally Abused:   Marland Kitchen Physically Abused:   . Sexually Abused:     Outpatient Medications Prior to Visit  Medication Sig Dispense Refill  . albuterol (VENTOLIN HFA) 108 (90 Base) MCG/ACT inhaler Inhale 2 puffs into the lungs every 4 (four) hours as needed for wheezing or shortness of breath. 18 g  0  . sildenafil (REVATIO) 20 MG tablet Take 2 to 5 tablets one hour prior to sexual activity 100 tablet 1   No facility-administered medications prior to visit.    No Known Allergies  ROS Review of Systems  Constitutional: Negative for activity change, appetite change, fatigue and fever.  HENT: Negative for congestion, ear pain and trouble swallowing.   Eyes: Negative for pain and visual disturbance.  Respiratory: Negative for cough, shortness of breath and wheezing.   Cardiovascular: Negative for chest pain and palpitations.  Gastrointestinal: Negative for abdominal distention, abdominal pain, blood in stool, constipation, diarrhea, nausea, rectal pain and vomiting.  Genitourinary: Negative for dysuria, hematuria and testicular pain.  Musculoskeletal: Negative for arthralgias and joint swelling.  Skin: Negative for rash.  Neurological: Negative for dizziness, syncope and headaches.  Hematological: Negative for adenopathy.  Psychiatric/Behavioral: Negative for confusion and dysphoric mood.      Objective:    Physical Exam Constitutional:      General: He is not in acute distress.    Appearance: He is well-developed.  HENT:     Head: Normocephalic and atraumatic.     Right Ear: External ear normal.     Left Ear: External ear normal.  Eyes:     Conjunctiva/sclera: Conjunctivae normal.     Pupils: Pupils are equal, round, and reactive to light.  Neck:     Thyroid: No thyromegaly.  Cardiovascular:     Rate and Rhythm: Normal rate and regular rhythm.     Heart sounds: Normal heart sounds. No murmur heard.   Pulmonary:     Effort: No respiratory distress.     Breath sounds: No wheezing or rales.  Abdominal:     General: Bowel sounds are normal. There is no distension.     Palpations: Abdomen is soft. There is no mass.     Tenderness: There is no abdominal tenderness. There is no guarding or rebound.  Musculoskeletal:     Cervical back: Normal range of motion and neck  supple.  Lymphadenopathy:     Cervical: No cervical adenopathy.  Skin:    Findings: No rash.     Comments: He has scars anterior chest and mid upper back from previous lesion excisions.  Darkly tanned skin.  No concerning lesions noted  Neurological:     Mental Status: He is alert and oriented to person, place, and time.     Cranial Nerves: No cranial  nerve deficit.     Deep Tendon Reflexes: Reflexes normal.     BP 140/68 (BP Location: Right Arm, Cuff Size: Normal)   Pulse 71   Temp 97.9 F (36.6 C) (Temporal)   Ht 6\' 1"  (1.854 m)   Wt 194 lb 3.2 oz (88.1 kg)   SpO2 96%   BMI 25.62 kg/m  Wt Readings from Last 3 Encounters:  10/08/19 194 lb 3.2 oz (88.1 kg)  03/05/18 200 lb (90.7 kg)  02/17/18 200 lb (90.7 kg)     Health Maintenance Due  Topic Date Due  . HIV Screening  Never done    There are no preventive care reminders to display for this patient.  Lab Results  Component Value Date   TSH 0.61 11/17/2017   Lab Results  Component Value Date   WBC 6.8 11/17/2017   HGB 14.5 11/17/2017   HCT 43.1 11/17/2017   MCV 87.6 11/17/2017   PLT 246.0 11/17/2017   Lab Results  Component Value Date   NA 140 11/17/2017   K 4.6 11/17/2017   CO2 31 11/17/2017   GLUCOSE 104 (H) 11/17/2017   BUN 16 11/17/2017   CREATININE 1.12 11/17/2017   BILITOT 0.5 11/17/2017   ALKPHOS 84 11/17/2017   AST 17 11/17/2017   ALT 14 11/17/2017   PROT 7.3 11/17/2017   ALBUMIN 4.2 11/17/2017   CALCIUM 9.6 11/17/2017   GFR 71.88 11/17/2017   Lab Results  Component Value Date   CHOL 200 11/17/2017   Lab Results  Component Value Date   HDL 54.20 11/17/2017   Lab Results  Component Value Date   LDLCALC 135 (H) 11/17/2017   Lab Results  Component Value Date   TRIG 57.0 11/17/2017   Lab Results  Component Value Date   CHOLHDL 4 11/17/2017   No results found for: HGBA1C    Assessment & Plan:   Physical exam.  He has history of lentigo maligna melanoma was diagnosed a little  over a year ago.  He does express some concerns regarding memory deficits over the past year.  He realizes some of this may be fatigue and increased stress.  He does have positive family history of dementia in his mother  -We discussed getting screening labs.  Will include B12 level -Discussed Shingrix vaccine and he declines -Discussed Covid vaccine he declines -Tetanus up-to-date -Colonoscopy up-to-date -Previous hepatitis C screening negative -Set up neuropsychological assessment regarding his concern for memory deficits  No orders of the defined types were placed in this encounter.   Follow-up: No follow-ups on file.    Carolann Littler, MD

## 2019-10-09 LAB — LIPID PANEL
Cholesterol: 207 mg/dL — ABNORMAL HIGH (ref <200)
HDL: 73 mg/dL (ref >=40)
LDL Cholesterol (Calc): 118 mg/dL (calc) — ABNORMAL HIGH
Non-HDL Cholesterol (Calc): 134 mg/dL (calc) — ABNORMAL HIGH (ref <130)
Total CHOL/HDL Ratio: 2.8 (calc) (ref <5.0)
Triglycerides: 64 mg/dL (ref <150)

## 2019-10-09 LAB — BASIC METABOLIC PANEL
BUN: 13 mg/dL (ref 7–25)
CO2: 30 mmol/L (ref 20–32)
Calcium: 9.1 mg/dL (ref 8.6–10.3)
Chloride: 104 mmol/L (ref 98–110)
Creat: 1.02 mg/dL (ref 0.70–1.33)
Glucose, Bld: 96 mg/dL (ref 65–99)
Potassium: 4.4 mmol/L (ref 3.5–5.3)
Sodium: 141 mmol/L (ref 135–146)

## 2019-10-09 LAB — CBC WITH DIFFERENTIAL/PLATELET
Absolute Monocytes: 469 cells/uL (ref 200–950)
Basophils Absolute: 42 cells/uL (ref 0–200)
Basophils Relative: 0.6 %
Eosinophils Absolute: 168 cells/uL (ref 15–500)
Eosinophils Relative: 2.4 %
HCT: 42.3 % (ref 38.5–50.0)
Hemoglobin: 14.3 g/dL (ref 13.2–17.1)
Lymphs Abs: 1813 cells/uL (ref 850–3900)
MCH: 30 pg (ref 27.0–33.0)
MCHC: 33.8 g/dL (ref 32.0–36.0)
MCV: 88.7 fL (ref 80.0–100.0)
MPV: 9.6 fL (ref 7.5–12.5)
Monocytes Relative: 6.7 %
Neutro Abs: 4508 cells/uL (ref 1500–7800)
Neutrophils Relative %: 64.4 %
Platelets: 249 10*3/uL (ref 140–400)
RBC: 4.77 10*6/uL (ref 4.20–5.80)
RDW: 13 % (ref 11.0–15.0)
Total Lymphocyte: 25.9 %
WBC: 7 10*3/uL (ref 3.8–10.8)

## 2019-10-09 LAB — HEPATIC FUNCTION PANEL
AG Ratio: 1.4 (calc) (ref 1.0–2.5)
ALT: 17 U/L (ref 9–46)
AST: 22 U/L (ref 10–35)
Albumin: 4.2 g/dL (ref 3.6–5.1)
Alkaline phosphatase (APISO): 81 U/L (ref 35–144)
Bilirubin, Direct: 0.1 mg/dL (ref 0.0–0.2)
Globulin: 3.1 g/dL (calc) (ref 1.9–3.7)
Indirect Bilirubin: 0.4 mg/dL (calc) (ref 0.2–1.2)
Total Bilirubin: 0.5 mg/dL (ref 0.2–1.2)
Total Protein: 7.3 g/dL (ref 6.1–8.1)

## 2019-10-09 LAB — VITAMIN B12: Vitamin B-12: 162 pg/mL — ABNORMAL LOW (ref 200–1100)

## 2019-10-09 LAB — TSH: TSH: 0.99 mIU/L (ref 0.40–4.50)

## 2019-10-09 LAB — PSA: PSA: 1.9 ng/mL (ref <=4.0)

## 2019-10-11 ENCOUNTER — Telehealth: Payer: Self-pay | Admitting: Family Medicine

## 2019-10-11 ENCOUNTER — Other Ambulatory Visit: Payer: Self-pay

## 2019-10-11 DIAGNOSIS — E538 Deficiency of other specified B group vitamins: Secondary | ICD-10-CM

## 2019-10-11 NOTE — Telephone Encounter (Signed)
Pt is calling in to see if he can start the B-12 shot that Dr. Elease Hashimoto was speaking of during his visit.  Would like to get started tomorrow.

## 2019-10-11 NOTE — Telephone Encounter (Signed)
Per lab results told pt to start otc b12

## 2019-12-16 ENCOUNTER — Encounter: Payer: Self-pay | Admitting: Counselor

## 2019-12-16 ENCOUNTER — Other Ambulatory Visit: Payer: Self-pay

## 2019-12-16 ENCOUNTER — Ambulatory Visit: Payer: No Typology Code available for payment source

## 2019-12-16 ENCOUNTER — Ambulatory Visit (INDEPENDENT_AMBULATORY_CARE_PROVIDER_SITE_OTHER): Payer: No Typology Code available for payment source | Admitting: Counselor

## 2019-12-16 DIAGNOSIS — F09 Unspecified mental disorder due to known physiological condition: Secondary | ICD-10-CM | POA: Diagnosis not present

## 2019-12-16 DIAGNOSIS — R41840 Attention and concentration deficit: Secondary | ICD-10-CM

## 2019-12-16 NOTE — Progress Notes (Signed)
   Psychometrist Note   Cognitive testing was administered to Paul Bird by Paul Bird, B.S. (Technician) under the supervision of Paul Bird, Psy.D., ABN. Paul Bird was able to tolerate all test procedures. Dr. Nicole Bird met with the patient as needed to manage any emotional reactions to the testing procedures. Rest breaks were offered.    The battery of tests administered was selected by Dr. Nicole Bird with consideration to the patient's current level of functioning, the nature of his symptoms, emotional and behavioral responses during the interview, level of literacy, observed level of motivation/effort, and the nature of the referral question. This battery was communicated to the psychometrist. Communication between Dr. Nicole Bird and the psychometrist was ongoing throughout the evaluation and Dr. Nicole Bird was immediately accessible at all times. Dr. Nicole Bird provided supervision to the technician on the date of this service, to the extent necessary to assure the quality of all services provided.    Paul Bird will return in approximately one week for an interactive feedback session with Dr. Nicole Bird, at which time test performance, clinical impressions, and treatment recommendations will be reviewed in detail. The patient understands he can contact our office should he require our assistance before this time.   A total of 130 minutes of billable time were spent with Paul Bird by the technician, including test administration and scoring time. Billing for these services is reflected in Dr. Les Pou note.   This note reflects time spent with the psychometrician and does not include test scores, clinical history, or any interpretations made by Dr. Nicole Bird. The full report will follow in a separate note.

## 2019-12-16 NOTE — Progress Notes (Signed)
[facility]  Patient Name: Paul Bird MRN: [id number] Date of Birth: March 08, 1961 Age: 59 y.o. Education: 14 years  Referral Circumstances and Background Information  Paul Bird is a 59 y.o., ambidextrous (but writes with the left hand) dominant, married man with a history of memory problems. He is a patient of Dr. Elease Hashimoto, who referred him for evaluation after the patient reported concerns that he repeats himself (on the part of his wife), misplacing things, difficulties with names, and some minor problems when driving. Dr. Anastasio Auerbach nicely detailed notes were reviewed and are appreciated.   On interview, Paul Bird reported that he and his wife have noticed memory and thinking problems over about the past 18 months. He attributed them to stress initially, he works in Secondary school teacher and was extremely busy working "5am to [employer] during the pandemic, but now things have calmed down and if anything, he feels his issues are getting worse. He reported that the past 5 years "have been a blur," because he was helping care for his mother who died of AD in her 35s several years ago. He hopes he is not developing the same condition. In terms of his day-to-day symptoms, he has had some minor issues driving although it is more driving past things than it is confusion about directions as per his report today. He admits that he is sometimes doing many things when he is driving and feels like his mind is on something else. He reported that he forgets peoples names that he has known for a while, they are people that he sees fairly often. He does have interaction with a large number of people related to his job and has "a lot of irons in the fire." His wife mentions that he repeats himself, although she is "like Einstein" and he often remembers that he told her something when when she corrects him. He has a harder time tracking his schedule in terms of work,  which is fairly pronounced. He wonders if some of his customers have noticed. He has missed a fair amount of appointments or will schedule over appointments and he stated that has happened on an almost daily basis. With respect to mood, he has always been very even keeled and has no depression but he feels "more reactive" and short tempered than in the past. His energy is generally fairly good, he was doing burnout boot camp and running, working out frequently with one of his daughters until she moved away. He reported that he is "all or nothing" and tends to work out in spurts. He has no major weight changes but hasn't been eating as healthily as he was previously because he is not on one of those kicks. 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   With respect to day-to-day functioning, it sounds like he is still doing adequately, although he is making minor errors at work and wonders if people have noticed. He hasn't been counseled by his superiors however and no one has taken him aside to discuss his cognitive errors. He is still driving and will go past turns, as above. He does have a hard time with his schedule and stated that he has a difficult time keeping track of meetings and obligations on an almost daily basis, he does have a challenging schedule to keep up. Part of his duties include coordinating  and planning events, and he reported that he dreads that now because it is very challenging whereas he didn't have trouble in the past. He misplaces things frequently. His wife generally manages the finances but he still is able to hand and use money. He feels like he isn't as good with orientation, although he doesn't lose the month or the year. He is not on any medications so he doesn't have any to remember. He stated that he has a hard time at the grocery store and his wife will need to send him  pictures of what she wants, although she is a bit particular with some items.   Past Medical History and Review of Relevant Studies   Patient Active Problem List   Diagnosis Date Noted  . Melanoma of skin ([place]) 11/19/2017  . Ingrown nail 08/12/2014  . Paronychia 08/12/2014  . ADD (attention deficit disorder) 10/12/2013  . DYSESTHESIA 09/08/2009  . 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 ACTINIC KERATOSIS 04/19/2009   Review of Neuroimaging and Relevant Medical History: The patient has no neuroimaging on file.  I see from review of notes that there was concern for ADD in 2015, when the patient started a new job, he was having a hard time getting organized, with distractibility, completing tasks, and misplacing things. He had an assessment of some sort with Dr. Elease Hashimoto that "strongly suggested" ADD, he was taking Vyvanse for a time but then stopped it by the time he followed up for his annual physical the next year. It sounds as though this diagnosis was discovered later on in life, when he was educating himself about his son's learning issues, and realized that many of his son's problems also described symptoms he was having. He took Vyvanse for a period of time but is not currently on any medication.   Current Outpatient Medications  Medication Sig Dispense Refill  . albuterol (VENTOLIN HFA) 108 (90 Base) MCG/ACT inhaler Inhale 2 puffs into the lungs every 4 (four) hours as needed for wheezing or shortness of breath. 18 g 0  . sildenafil (REVATIO) 20 MG tablet Take 2 to 5 tablets one hour prior to sexual activity 100 tablet 1   No current facility-administered medications for this visit.   Family History  Problem Relation Age of Onset  . Heart disease Father 70       ?viral myocarditis  . Colon cancer Neg Hx   . Colon polyps Neg Hx   . Rectal cancer Neg Hx   . Ulcerative colitis Neg Hx   . Stomach cancer Neg Hx   . Esophageal cancer Neg Hx    There is a family history of  dementia. The patient's mother developed dementia in her 29s or 6s "just a little bit" but didn't become symptomatic until later. His father passed in his 66s. He has a brother who is 72 and is doing adequately. There is no  family history of psychiatric illness.  Psychosocial History  Developmental, Educational and Employment History: The patient is a native of the [place] area. It sounds as though he had a great childhood and felt supported and nurtured by his parents. He reported that he was a very good student and was in the top 10% of his class and was never held back and didn't have any learning difficulties. He went to [employer] and studied HVAC, although he never got a degree. 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 He started out as a Merchant navy officer and worked his way up. 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 It sounds like his job is organizationally demanding with a lot of meetings, entertaining, and coordinating.    Psychiatric History: None  Substance Use History: The patient drinks socially but not excessively, he doesn't smoke, and he doesn't use any drugs.   Relationship History and Living Cimcumstances:  He had been married previously, to his high school sweetheart, and it didn't work out. He remarried a year ago, but they have been together 10 years. He has a son and a daughter from his first marriage, and his wife has two daughters. His son lives in the area and works for the [employer]. He has a grandchild by his daughter.   Mental Status and Behavioral Observations  Sensorium/Arousal: The patient's level of arousal was awake and  alert. Hearing and vision were adequate for testing purposes. Orientation: The patient was fully oriented to person, place, time, and situation.  Appearance: Dressed neatly in Forensic scientist Behavior: The patient was pleasant, appropriate, and presented as quite forthcoming about his concerns and cognitive history.  Speech/language: Normal in rate, rhythm, volume, and prosody.  Gait/Posture: Appeared normal on observation of ambulation Movement: The patient did not demonstrate any observable motor symptoms such as tremor, hypokinesia, bradykinesia or the like Social Comportment: Pleasant, appropriate Mood: Patient reported he has been shorter lately with his wife Affect: Mainly euthymic, perhaps with some anxiety, did become tearful briefly when talking about his mother and his current concerns.  Thought process/content: Thought process was generally logical and linear, with a bit of expansiveness at times. He did lose his train of thought on some occasions and reported that is one of the more concerning symptoms.  Safety: No safety concerns identified at this encounter Insight: [place] Cognitive Assessment  12/16/2019  Visuospatial/ Executive (0/5) 5  Naming (0/3) 3  Attention: Read list of digits (0/2) 2  Attention: Read list of letters (0/1) 1  Attention: Serial 7 subtraction starting at 100 (0/3) 3  Language: Repeat phrase (0/2) 1  Language : Fluency (0/1) 0  Abstraction (0/2) 2  Delayed Recall (0/5) 2  Orientation (0/6) 6  Total 25  Adjusted Score (based on education) 25   Test Procedures  Wide Range Achievement Test - 4 Word Reading Doy Mince' Intellectual Screening Test Wechsler Adult Intelligence Scale - IV  Digit Span  Arithmetic  Symbol Search  Coding Neuropsychological Assessment Battery  Memory Module  Visual Chiropractor ACS Word Choice The Dot Counting Test Controlled Oral Word Association (F-A-S) Conservation officer, historic buildings  (Animals) Trail Making Test A & B Wisconsin Card Sorting Test - 64 Delis-Kaplan Executive Functions System  Color-word Interference Patient Health Questionnaire - 9 GAD-7  Plan  Paul Bird was seen for a psychiatric diagnostic evaluation and neuropsychological testing. He is a gregarious 59 year old, abidextrous (but writes with the left hand) man with a history of memory and thinking problems noticed by himself and his wife over approximately the past 18 months. He provided his own history today, I asked if he would like his wife involved, but he preferred for me to not speak to her. He is having  minor day-to-day problems like forgetting meetings, getting mixed up with his schedule, and unfortunately he thinks they have been worsening over time. This is despite the fact that his job is now back to normal, he was working more hours than usual during the pandemic. Full and complete note with impressions, recommendations, and interpretation of test data to follow.   Viviano Simas Nicole Kindred, PsyD, [place] Clinical Neuropsychologist  Informed Consent and Coding/Compliance  Risks and benefits of the evaluation were discussed with the patient prior to all testing procedures. I conducted a clinical interview and neuropsychological testing (at least two tests) with Paul Bird and Lamar Benes, B.S. (Technician) administered additional test procedures. The patient was able to tolerate the testing procedures and the patient (and/or family if applicable) is likely to benefit from further follow up to receive the diagnosis and treatment recommendations, which will be rendered at the next encounter. Billing below reflects technician time, my direct face-to-face time with the patient, time spent in test administration, and time spent in professional activities including but not limited to: neuropsychological test interpretation, integration of neuropsychological test data with clinical history, report  preparation, treatment planning, care coordination, and review of diagnostically pertinent medical history or studies.   Services associated with this encounter: Clinical Interview [phone]) plus 60 minutes (01027; Neuropsychological Evaluation by Professional)  155 minutes (25366; Neuropsychological Evaluation by Professional, Adl.) 20 minutes (44034; Test Administration by Professional) 30 minutes (74259; Neuropsychological Testing by Technician) 100 minutes (56387; Neuropsychological Testing by Technician, Adl.)

## 2019-12-17 NOTE — Progress Notes (Signed)
[facility]  Patient Name: Paul Bird MRN: [id number] Date of Birth: [birth year] Age: 59 y.o. Education: 14 years  Measurement properties of test scores: IQ, Index, and Standard Scores (SS): Mean = 100; Standard Deviation = 15 Scaled Scores (Ss): Mean = 10; Standard Deviation = 3 Z scores (Z): Mean = 0; Standard Deviation = 1 T scores (T); Mean = 50; Standard Deviation = 10  TEST SCORES:    Note: This summary of test scores accompanies the interpretive report and should not be interpreted by unqualified individuals or in isolation without reference to the report. Test scores are relative to age, gender, and educational history as available and appropriate.   Performance Validity        ACS: Raw  Descriptor      Word Choice 47 Within Expectation       Raw  Descriptor  The Dot Counting Test: 11 Within Expectation      Embedded Measures: Raw  Descriptor      WAIS-IV Reliable Digit Span 7 Within Expectation      WAIS-IV Reliable Digit Span Revised 11 Within Expectation      Mental Status Screening     Total Score Descriptor  MoCA 25 Normal      Expected Functioning        Wide Range Achievement Test: Standard/Scaled Score Percentile      Word Reading 101 53      Reynolds Intellectual Screening Test Standard/T-score Percentile      Guess What 49 46      Odd Item Out 52 58  RIST Index 101 53      Attention/Processing Speed        Wechsler Adult Intelligence Scale - IV: Standard/Scaled Score Percentile  Working Memory Index 89 23      Digit Span 6 9          Digit Span Forward 8 25          Digit Span Backward 6 9          Digit Span Sequencing 7 16      Arithmetic 10 50  Processing Speed Index 108 70      Symbol Search 12 75      Coding 11 63      Language        Neuropsychological Assessment Battery (Language Module, Form 1): T-score Percentile      Naming   (31) 54 66      Verbal Fluency:  T Score Percentile       Controlled Oral Word Association (F-A-S) 38 12      Semantic Fluency (Animals) 32 4      Memory:        Neuropsychological Assessment Battery (Memory Module, Form 1): T-score/Standard Score Percentile  Memory Index (MEM): 67 1      List Learning           List A Immediate Recall   (3 , 4 , 4) 27 1         List B Immediate Recall   (2) 34 5         List A Short Delayed Recall   (3) 30 2         List A Long Delayed Recall   (2) 28 2         List A Percent Retention   (67 %) --- 16         List A  Long Delayed Yes/No Recognition Hits   (6) --- <1         List A Long Delayed Yes/No Recognition False Alarms   (10) --- <1         List A Recognition Discriminability Index --- <1      Shape Learning           Immediate Recognition   (3 , 5 , 6) 44 27         Delayed Recognition   (5) 44 27         Percent Retention   (83 %) --- 27         Delayed Forced-Choice Recognition Hits   (7) --- 27         Delayed Forced-Choice Recognition False Alarms   (1) --- 38         Delayed Forced-Choice Recognition Discriminability --- 27     Story Learning           Immediate Recall   (5, 14) 21 <1         Delayed Recall   (12) 31 3         Percent Retention   (86 %) --- 38      Daily Living Memory            Immediate Recall   (21, 17) 42 21          Delayed Recall   (8, 2) 34 5          Percent Retention (37 %) --- <1          Recognition Hits   (8) --- 25      Visuospatial/Constructional Functioning        Neuropsychological Assessment Battery (Visuospatial Module) T-score Percentile      Visual Discrimination 38 12      Catering manager 34 5      Executive Functioning        Wisconsin Card Sorting Test - 64: T-score Percentile      Categories --- >16%ile      Total Errors 51 54      Perseverative Errors 59 82      Nonperseverative Errors 41 18      Conceptual Level Responses 48 43      D-KEFS Color-Word Interference Test: Raw (Scaled Score) Percentile     Color Naming 26 secs. (12) 75       Word Reading 22 secs. (11) 63     Inhibition 47 secs. (13) 84        Total Errors 0 errors (12) 75     Inhibition/Switching 52 secs. (13) 84        Total Errors 1 errors (11) 63      Trail Making Test: T-Score Percentile      Part A 65 93      Part B 68 96      Clock Drawing Raw Score Descriptor      Command 10 WNL      Rating Scales         Raw Score Descriptor  Patient Health Questionnaire - 9 4 Within Normal Limits  GAD-7 4 Minimal      Clinical Dementia Rating Raw Score Descriptor      Sum of Boxes 1.5 Mild Cognitive Impairment      Global Score 0.5 MCI   Derelle Cockrell V. Nicole Kindred PsyD, [place] Clinical Neuropsychologist

## 2019-12-23 ENCOUNTER — Ambulatory Visit (INDEPENDENT_AMBULATORY_CARE_PROVIDER_SITE_OTHER): Payer: No Typology Code available for payment source | Admitting: Counselor

## 2019-12-23 ENCOUNTER — Other Ambulatory Visit: Payer: Self-pay

## 2019-12-23 ENCOUNTER — Encounter: Payer: Self-pay | Admitting: Counselor

## 2019-12-23 ENCOUNTER — Telehealth: Payer: Self-pay | Admitting: Counselor

## 2019-12-23 DIAGNOSIS — G3184 Mild cognitive impairment, so stated: Secondary | ICD-10-CM

## 2019-12-23 HISTORY — DX: Mild cognitive impairment of uncertain or unknown etiology: G31.84

## 2019-12-23 NOTE — Progress Notes (Signed)
[facility]  Telemedicine statement:  I discussed the limitations of neuropsychological care via telemedicine and the availability of in person appointments. The patient expressed understanding and agreed to proceed. The patient was verified with two identifiers.  The visit modality was: telephonic The patient location was: home The provider location was: office  The following individuals participated: Leanora Cover  Feedback Note: I met with Leanora Cover to review the findings resulting from his neuropsychological evaluation. Since the last appointment, he has been about the same. He spoke with a friend about his cognitive difficulties who thinks it is because he does too much. 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 Time was spent reviewing the impressions and recommendations that are detailed in the evaluation report. I was candid with Mr. Matranga that he has MCI and is likely at risk for decline, although at this point whether this is due to an underlying condition or another etiology cannot be ascertained with a high level of certainty. I suggested that he involve his wife, because if this is AD, he will eventually need help. I also explained that individuals with the condition are often the last to notice their issues and that the MCI diagnosis is based on his report that he is still functioning adequately at work. Further topics of discussion as reflected in the patient instructions. I took time to explain the findings and answer all the patient's questions. I encouraged Mr. Pinzon to contact me should he have any further questions or if further follow up is desired.   Current Medications and Medical History   Current Outpatient Medications  Medication Sig Dispense Refill  . albuterol (VENTOLIN HFA) 108 (90 Base) MCG/ACT inhaler Inhale 2 puffs into the lungs every 4 (four) hours  as needed for wheezing or shortness of breath. 18 g 0  . sildenafil (REVATIO) 20 MG tablet Take 2 to 5 tablets one hour prior to sexual activity 100 tablet 1   No current facility-administered medications for this visit.    Patient Active Problem List   Diagnosis Date Noted  . Melanoma of skin ([place]) 11/19/2017  . Ingrown nail 08/12/2014  . Paronychia 08/12/2014  . ADD (attention deficit disorder) 10/12/2013  . DYSESTHESIA 09/08/2009  . 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 ACTINIC KERATOSIS 04/19/2009    Mental Status and Behavioral Observations  MICHELE KERLIN was available at the prespecified time for this telephonic appointment and was alert and generally oriented (orientation not formally assessed). Speech was normal in rate, rhythm, volume, and prosody. Self-reported mood was "Good" and affect as assessed by vocal quality was euthymic. Thought process was logical and goal oriented and thought content was appropriate to the topics discussed. There were no safety concerns identified at today's encounter, such as thoughts of harming self or others.   Plan  Feedback provided regarding the patient's neuropsychological evaluation. I recommended that he work with Dr. Elease Hashimoto on an MRI of the brain. A trial of Aricept could be considered. He should return for reevaluation in 1 to 2 years. It would be beneficial if he did involve his wife in his care, to make sure that nothing is being missed and that his assessment of functioning is accurate. LUCION DILGER was encouraged to contact me if any questions arise or if further follow up is desired.   Viviano Simas Nicole Kindred PsyD, [employer] Clinical Neuropsychologist  Service(s) Provided  at This Encounter: 38 minutes ([id number]; Psychotherapy with patient/family)

## 2019-12-23 NOTE — Patient Instructions (Signed)
Your performance and presentation on neuropsychological evaluation was consistent with diminished memory performance and difficulties in other areas including on measures of visuospatial and constructional functioning. You are still functioning well day-to-day, and thus, the best diagnosis is mild cognitive impairment. 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   The major difference between mild cognitive impairment (MCI) and dementia is in severity and potential prognosis. Once someone reaches a level of severity adequate to be diagnosed with a dementia, there is usually progression over time, though this may be years. On the other hand, mild cognitive impairment, while a significant risk for dementia in future, does not always progress to dementia, and in some instances stays the same or can even revert to normal. It is important to realize that if MCI is due to underlying Alzheimer's disease, it will most likely progress to dementia eventually. The rate of conversion to Alzheimer's dementia from amnestic MCI is about 15% per year versus the general population risk of conversion of 2% per year.   In your case, the profile of findings is concerning that your mild cognitive impairment may represent a prodromal developing condition. This cannot be known with certainty, however, and so it may be most accurate to think of it as a risk factor at this point in time. The good news is that there are many things you can do that may help mitigate your changes of any further decline.   First, there are medications that can be helpful. I would like to highlight that they are not curative nor do they affect the disease process itself if your difficulties are due to an underlying condition, but they can help people maintain function in the face of illness and might be helpful. I would discuss whether such a medication is appropriate for you  with Dr. Elease Hashimoto.   Another reasonable step in your workup would be an MRI of the brain. This can help [place] make sure that nothing is being missed, but can also be helpful for knowing what is causing your problem in and of itself, because neurodegenerative conditions are often accompanied by shrinkage in different parts of the brain that can be seen on such a study.   Now is an excellent time to practice healthy lifestyle changes that have been shown to be of benefit in delaying progression in mild cognitive impairment. These generally include diet and exercise. You are already exercising regularly, for which I comment you. You may also wish to start eating a brain-healthy diet, such as the MIND DASH diet, which has good quality evidence for preventing decline in MCI.   There is now good quality evidence from at least one large scale study that a modified mediterranean diet may help slow cognitive decline. This is known as the "MIND" diet. The Mind diet is not so much a specific diet as it is a set of recommendations for things that you should and should not eat.   Foods that are ENCOURAGED on the MIND Diet:  Green, leafy vegetables: Aim for six or more servings per week. This includes kale, spinach, cooked greens and salads.  All other vegetables: Try to eat another vegetable in addition to the green leafy vegetables at least once a day. It is best to choose non-starchy vegetables because they have a lot of nutrients with a low number of calories.  Berries: Eat berries at least twice a week. There is a plethora of research on  strawberries, and other berries such as blueberries, raspberries and blackberries have also been found to have antioxidant and brain health benefits.  Nuts: Try to get five servings of nuts or more each week. The creators of the [place] don't specify what kind of nuts to consume, but it is probably best to vary the type of nuts you eat to obtain a variety of nutrients. Peanuts  are a legume and do not fall into this category.  Olive oil: Use olive oil as your main cooking oil. There may be other heart-healthy alternatives such as algae oil, though there is not yet sufficient research upon which to base a formal recommendation.  Whole grains: Aim for at least three servings daily. Choose minimally processed grains like oatmeal, quinoa, brown rice, whole-wheat pasta and 100% whole-wheat bread.  Fish: Eat fish at least once a week. It is best to choose fatty fish like salmon, sardines, trout, tuna and mackerel for their high amounts of omega-3 fatty acids.  Beans: Include beans in at least four meals every week. This includes all beans, lentils and soybeans.  Poultry: Try to eat chicken or [place] at least twice a week. Note that fried chicken is not encouraged on the MIND diet.  Wine: Aim for no more than one glass of alcohol daily. Both red and white wine may benefit the brain. However, much research has focused on the red wine compound resveratrol, which may help protect against Alzheimer's disease.  Foods that are DISCOURAGED on the MIND Diet: Butter and margarine: Try to eat less than 1 tablespoon (about 14 grams) daily. Instead, try using olive oil as your primary cooking fat, and dipping your bread in olive oil with herbs.  Cheese: The MIND diet recommends limiting your cheese consumption to less than once per week.  Red meat: Aim for no more than three servings each week. This includes all beef, pork, lamb and products made from these meats.  Maceo Pro food: The MIND diet highly discourages fried food, especially the kind from fast-food restaurants. Limit your consumption to less than once per week.  Pastries and sweets: This includes most of the processed junk food and desserts you can think of. Ice cream, cookies, brownies, snack cakes, donuts, candy and more. Try to limit these to no more than four times a week.  You reported that you are still able to keep up with  demands at work. Any cognitive impairment, even mild cognitive impairment, can be undermining at a cognitively demanding job such as yours, and so I would recommend that you monitor the situation closely. This would be considered a disabling condition in my professional opinion if you find that you are not able to perform adequately in the future.   You expressed some interest in contributing to science and making the world a better place for others. While it is not clear that your difficulties are necessarily due to Alzheimer's disease at this point, you may consider contacting [place], who have ongoing trials enrolling individuals with the disease, individuals with normal cognition, and even those concerned about their memory. Their website is [email or web address]  For very detailed information about lifestyle based approaches to treating Alzheimer's disease and dementia, you might consider picking up a copy of "The End of Alzheimer's" by Brent Bulla. This is suggested not as an endorsement of Dr. Jacqlyn Krauss specific treatment protocol or products, but as a source of information regarding possibly effective integrative treatments that may have promise  for slowing or otherwise mitigating cognitive decline.

## 2019-12-23 NOTE — Telephone Encounter (Signed)
I spoke with Paul Bird on the date and time of this note. He wanted to make sure that his B12 level was considered in the evaluation. I do see that he had low B12 level, which can certainly result in cognitive interference, although I don't think that explains some of the non-memory findings as well. He also reports an insidious and progressive course, which might or might not fit with B12 deficiency and had been supplementing it for about 2 months at the time of his testing. I encouraged him to follow up with Dr. Elease Hashimoto.

## 2019-12-23 NOTE — Progress Notes (Signed)
[facility]  Patient Name: Paul Bird MRN: [id number] Date of Birth: [birth year] Age: 59 y.o. Education: 32 years  Clinical Impressions  Paul Bird is a 58 y.o., ambidextrous (but writes with the left hand) dominant, married man who is generally healthy. He is a patient of Dr. Erick Blinks and was referred after complaining of day-to-day forgetfulness, noticed by him and his wife. Unfortunately, his cognitive errors have begun to affect his work and he reported that he has a hard time particularly with losing his train of thought and with scheduling things (he is missing or having difficulties recalling a scheduled event nearly every day). He wonders if others have noticed his cognitive difficulties at work although nobody has commented on them. The interview did not benefit from an informant but [name] presented as fairly insightful and forthcoming about his problems. He has no neuroimaging. He apparently has a history of ADHD, although it was diagnosed later in life and he has managed without medication or psychological treatment for many years.   Neuropsychological testing is concerning for a decline in memory abilities with overall performance presenting at an extremely low level relative to his age and education cohort. He had the most difficulty with verbal measures. While he is still retaining some information across time, the pattern is concerning for a developing storage problem. He also had diminished verbal fluency, with semantic nominally lower than phonemic fluency and may have some mild diminution with working memory although his overall performance in that domain fell at a reasonable low average level. Performance on visuospatial and constructional measures was also low. He screened negative for the presence of depression.   [name] presentation is thus consistent with a mild cognitive impairment level problem. While he does have a  history of ADHD, his testing shows developing memory storage problems and other putative "cortical" signs in the form of visuospatial and constructional problems.This is concerning for the possibility of an incipient underlying condition. He should be followed over time. Would recommend MRI of the brain for further assessment, to rule out any mass lesions, vascular pathology, and assess for atrophy. A trial of antidementia medication could be considered at Dr. Erick Blinks discretion. While he is able to keep up with his job duties at this time, even mild cognitive impairment can be problematic for a cognitively demanding job and the situation should be closely monitored. Would also recommend that he begin healthy lifestyle changes such as eating a MIND DASH diet, he is already very good about exercising. Would recommend re-evaluation in one to two years or as clinically indicated, at Dr. Erick Blinks discretion.   Diagnostic Impressions: Amnestic mild cognitive impairment  Recommendations to be discussed with patient  Your performance and presentation on neuropsychological evaluation was consistent with diminished memory performance and difficulties in other areas including on measures of visuospatial and constructional functioning. You are still functioning well day-to-day, and thus, the best diagnosis is mild cognitive impairment. 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   The major difference between mild cognitive impairment (MCI) and dementia is in severity and potential prognosis. Once someone reaches a level of severity adequate to be diagnosed with a dementia, there is usually progression over time, though this may be years. On the other hand, mild cognitive impairment, while a significant risk for dementia in future, does not always progress to dementia, and in some instances stays the same or can even revert  to  normal. It is important to realize that if MCI is due to underlying Alzheimer's disease, it will most likely progress to dementia eventually. The rate of conversion to Alzheimer's dementia from amnestic MCI is about 15% per year versus the general population risk of conversion of 2% per year.   In your case, the profile of findings is concerning that your mild cognitive impairment may represent a prodromal developing condition. This cannot be known with certainty, however, and so it may be most accurate to think of it as a risk factor at this point in time. The good news is that there are many things you can do that may help mitigate your changes of any further decline.   First, there are medications that can be helpful. I would like to highlight that they are not curative nor do they affect the disease process itself if your difficulties are due to an underlying condition, but they can help people maintain function in the face of illness and might be helpful. I would discuss whether such a medication is appropriate for you with Dr. Elease Hashimoto.   Another reasonable step in your workup would be an MRI of the brain. This can help [place] make sure that nothing is being missed, but can also be helpful for knowing what is causing your problem in and of itself, because neurodegenerative conditions are often accompanied by shrinkage in different parts of the brain that can be seen on such a study.   Now is an excellent time to practice healthy lifestyle changes that have been shown to be of benefit in delaying progression in mild cognitive impairment. These generally include diet and exercise. You are already exercising regularly, for which I comment you. You may also wish to start eating a brain-healthy diet, such as the MIND DASH diet, which has good quality evidence for preventing decline in MCI.   There is now good quality evidence from at least one large scale study that a modified mediterranean diet may help  slow cognitive decline. This is known as the "MIND" diet. The Mind diet is not so much a specific diet as it is a set of recommendations for things that you should and should not eat.   Foods that are ENCOURAGED on the MIND Diet:  Green, leafy vegetables: Aim for six or more servings per week. This includes kale, spinach, cooked greens and salads.  All other vegetables: Try to eat another vegetable in addition to the green leafy vegetables at least once a day. It is best to choose non-starchy vegetables because they have a lot of nutrients with a low number of calories.  Berries: Eat berries at least twice a week. There is a plethora of research on strawberries, and other berries such as blueberries, raspberries and blackberries have also been found to have antioxidant and brain health benefits.  Nuts: Try to get five servings of nuts or more each week. The creators of the [place] don't specify what kind of nuts to consume, but it is probably best to vary the type of nuts you eat to obtain a variety of nutrients. Peanuts are a legume and do not fall into this category.  Olive oil: Use olive oil as your main cooking oil. There may be other heart-healthy alternatives such as algae oil, though there is not yet sufficient research upon which to base a formal recommendation.  Whole grains: Aim for at least three servings daily. Choose minimally processed grains like oatmeal, quinoa, brown rice,  whole-wheat pasta and 100% whole-wheat bread.  Fish: Eat fish at least once a week. It is best to choose fatty fish like salmon, sardines, trout, tuna and mackerel for their high amounts of omega-3 fatty acids.  Beans: Include beans in at least four meals every week. This includes all beans, lentils and soybeans.  Poultry: Try to eat chicken or [place] at least twice a week. Note that fried chicken is not encouraged on the MIND diet.  Wine: Aim for no more than one glass of alcohol daily. Both red and white wine may  benefit the brain. However, much research has focused on the red wine compound resveratrol, which may help protect against Alzheimer's disease.  Foods that are DISCOURAGED on the MIND Diet: Butter and margarine: Try to eat less than 1 tablespoon (about 14 grams) daily. Instead, try using olive oil as your primary cooking fat, and dipping your bread in olive oil with herbs.  Cheese: The MIND diet recommends limiting your cheese consumption to less than once per week.  Red meat: Aim for no more than three servings each week. This includes all beef, pork, lamb and products made from these meats.  Maceo Pro food: The MIND diet highly discourages fried food, especially the kind from fast-food restaurants. Limit your consumption to less than once per week.  Pastries and sweets: This includes most of the processed junk food and desserts you can think of. Ice cream, cookies, brownies, snack cakes, donuts, candy and more. Try to limit these to no more than four times a week.  You reported that you are still able to keep up with demands at work. Any cognitive impairment, even mild cognitive impairment, can be undermining at a cognitively demanding job such as yours, and so I would recommend that you monitor the situation closely. This would be considered a disabling condition in my professional opinion if you find that you are not able to perform adequately in the future.   You expressed some interest in contributing to science and making the world a better place for others. While it is not clear that your difficulties are necessarily due to Alzheimer's disease at this point, you may consider contacting [place], who have ongoing trials enrolling individuals with the disease, individuals with normal cognition, and even those concerned about their memory. Their website is  [email or web address]  Test Findings  Test scores are summarized in additional documentation associated with this encounter. Test scores are relative to age, gender, and educational history as available and appropriate. There were no concerns about performance validity as all findings fell within normal expectations.   General Intellectual Functioning/Achievement:  Performance on single-word reading fell at an average level. Performance was similarly average on the RIST index, with comparable average range scores on the verbally and visually oriented subtests. Therefore, average presents as a reasonable standard of comparison for Mr. Hitsman' cognitive test performance.   Attention and Processing Efficiency: Indicators of attention and working memory fell at a reasonable low average level overall, although his digit repetition was a bit weak, falling at an unusually low level. Digit repetition forward was average, whereas digit repetition backward was unusually low and digit resequencing in ascending order was low average. Performance was good and in the average range on mental solving of arithmetical word problems without paper and pencil.   Timed measures of processing speed fell near the top of the high average range, with average scores on indicators of timed number-symbol coding  and on a symbol matching to sample measure of efficient visual scanning and visual matching.   Language: Performance on language measures showed good fundamental abilities on visual object confrontation naming, although generation of words in response to given letters was a bit weak. Phonemic fluency was low average whereas semantic fluency was unusually low.   Visuospatial  Function: Performance on visuospatial and constructional indicators was below expectation. A low average score was demonstrated on a measure of find visual discrimination and matching to exemplar. Construction of two-dimensional target designs from a series of small plastic tile pieces was unusually low.   Learning and Memory: Performance on measures of learning and memory was below expectation for Mr. Utter with an extremely low performance level overall. He had difficulties on verbal measures and the profile is concerning for developing storage problems with diminished encoding of information and weak retention of information across time in most cases.   In the verbal realm, Mr. Maland demonstrated extremely low immediate recall for a 12-item word list across three learning trials learning 3, 4, and 4 words. He recalled 3 on short delayed recall and then 2 on long-delayed recall, generating scores hovering around the unusually to extremely low ranges. Delayed yes/no recognition was low with more false positives than correct identifications. Memory for a short story was extremely low on immediate recall although he did recall this information better wih adequate average retention of time yet still an unusually low score related to the amount of information initially encoded. Memory for brief daily living type information was low average on immediate recall with unusually low delayed recall.   In the visual realm, immediate recognition of a series of designs that are difficult to verbally encode was average followed by reasonable retention of information and average delayed recognition. Identifying the designs using a yes/no recognition formal was average.   Executive Functions: Performance on executive measures was within normal limits. He solved a normal number of solution categories on the [employer] with a good high average range perseverative errors scores and reasonable scores on  other metrics. Alternating sequencing of numbers and letters of the alphabet was superior. Generation of words in response to given letters was low average. He did well inhibiting a dominant response in favor of a novel response on color-word reading with a high average score and switching between different response sets on the inhibition/switching condition of the test. Clock drawing was normal with good formation of the face, number placement, and hand placement.   Rating Scale(s): Mr. Olander screened negative for the presence of clinically significant depression and anxiety. His CDR is a Sum of Boxes of 1.5 and his global score is 0.5, which is mild cognitive impairment.   Viviano Simas Nicole Kindred PsyD, [place] Clinical Neuropsychologist

## 2020-01-05 ENCOUNTER — Ambulatory Visit: Payer: No Typology Code available for payment source | Admitting: Family Medicine

## 2020-01-05 ENCOUNTER — Other Ambulatory Visit: Payer: Self-pay

## 2020-01-05 ENCOUNTER — Encounter: Payer: Self-pay | Admitting: Family Medicine

## 2020-01-05 VITALS — BP 120/74 | HR 67 | Temp 98.0°F | Ht 73.0 in | Wt 195.4 lb

## 2020-01-05 DIAGNOSIS — G3184 Mild cognitive impairment, so stated: Secondary | ICD-10-CM

## 2020-01-05 DIAGNOSIS — E538 Deficiency of other specified B group vitamins: Secondary | ICD-10-CM | POA: Diagnosis not present

## 2020-01-05 LAB — VITAMIN B12: Vitamin B-12: 269 pg/mL (ref 200–1100)

## 2020-01-05 NOTE — Patient Instructions (Signed)
Continue the B12 1,000 mcg daily  We will set up MRI of brain.    If above both normal, consider trial of Aricept.

## 2020-01-05 NOTE — Progress Notes (Signed)
Established Patient Office Visit  Subjective:  Patient ID: Paul Bird, male    DOB: 10/12/1960  Age: 59 y.o. MRN: [id number]  CC:  Chief Complaint  Patient presents with  . Follow-up    HPI IHAN PAT presents for follow-up regarding memory concerns.  We referred him to one of our neuropsychologists.  They confirmed mild cognitive impairment with memory loss.  Patient just lost his mother to Alzheimer's dementia this past year.  Neuropsychologist had definite concerns he has had some memory deficit.  With recent lab work his B12 came back 162.  He is taking oral replacement with daily 1000 mcg daily.  He has been compliant with that.  He has not seen a change in memory or executive function since starting the B12 a few months ago.  We plan follow-up B12 this time.  Neuropsychologist had suggested considering MRI brain to further assess.  Patient has not had any frequent headaches or any focal weakness.  Other recent screening labs including thyroid were normal.  There was mention of consideration for medication such as Aricept but patient would like to get MRI and B12 levels first.  He continues to work full-time.  He is noting that with things like event planning he is having more difficulties with details.  He feels he is more irritable in general over the past year  Past Medical History:  Diagnosis Date  . Actinic keratosis 04/19/2009   Qualifier: Diagnosis of  By: Joyce Gross    . Adenomatous polyp   . Cancer [facility])    melanoma -- neck, chest   . DYSESTHESIA 09/08/2009   Qualifier: Diagnosis of  By: Elease Hashimoto MD, Bentzion Dauria    . Erectile dysfunction   . 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 COLONOSCOPY    . melanoma removal  2019   neck and chest   . NECK SURGERY    . POLYPECTOMY    . ROTATOR CUFF REPAIR  2001   right    Family History  Problem Relation Age  of Onset  . Heart disease Father 63       ?viral myocarditis  . Colon cancer Neg Hx   . Colon polyps Neg Hx   . Rectal cancer Neg Hx   . Ulcerative colitis Neg Hx   . Stomach cancer Neg Hx   . Esophageal cancer Neg Hx     Social History   Socioeconomic History  . Marital status: Divorced    Spouse name: Not on file  . Number of children: Not on file  . Years of education: Not on file  . Highest education level: Not on file  Occupational History  . Not on file  Tobacco Use  . Smoking status: Never Smoker  . Smokeless tobacco: Never Used  Substance and Sexual Activity  . Alcohol use: Yes    Comment: once every 1-2 weeks  . Drug use: No  . Sexual activity: Not on file  Other Topics Concern  . Not on file  Social History Narrative  . Not on file   Social Determinants of Health   Financial Resource Strain:   . Difficulty of Paying Living Expenses: Not on file  Food Insecurity:   . Worried About Charity fundraiser in the Last Year: Not on file  . Ran Out of Food in the Last Year: Not on  file  Transportation Needs:   . Film/video editor (Medical): Not on file  . Lack of Transportation (Non-Medical): Not on file  Physical Activity:   . Days of Exercise per Week: Not on file  . Minutes of Exercise per Session: Not on file  Stress:   . Feeling of Stress : Not on file  Social Connections:   . Frequency of Communication with Friends and Family: Not on file  . Frequency of Social Gatherings with Friends and Family: Not on file  . Attends Religious Services: Not on file  . Active Member of Clubs or Organizations: Not on file  . Attends Archivist Meetings: Not on file  . Marital Status: Not on file  Intimate Partner Violence:   . Fear of Current or Ex-Partner: Not on file  . Emotionally Abused: Not on file  . Physically Abused: Not on file  . Sexually Abused: Not on file    Outpatient Medications Prior to Visit  Medication Sig Dispense Refill  .  albuterol (VENTOLIN HFA) 108 (90 Base) MCG/ACT inhaler Inhale 2 puffs into the lungs every 4 (four) hours as needed for wheezing or shortness of breath. 18 g 0  . sildenafil (REVATIO) 20 MG tablet Take 2 to 5 tablets one hour prior to sexual activity 100 tablet 1   No facility-administered medications prior to visit.    No Known Allergies  ROS Review of Systems  Constitutional: Negative for fatigue.  Eyes: Negative for visual disturbance.  Respiratory: Negative for cough, chest tightness and shortness of breath.   Cardiovascular: Negative for chest pain, palpitations and leg swelling.  Neurological: Negative for dizziness, syncope, weakness, light-headedness and headaches.  Psychiatric/Behavioral: Negative for agitation and dysphoric mood.      Objective:    Physical Exam Constitutional:      Appearance: He is well-developed.  HENT:     Right Ear: External ear normal.     Left Ear: External ear normal.  Eyes:     Pupils: Pupils are equal, round, and reactive to light.  Neck:     Thyroid: No thyromegaly.  Cardiovascular:     Rate and Rhythm: Normal rate and regular rhythm.  Pulmonary:     Effort: Pulmonary effort is normal. No respiratory distress.     Breath sounds: Normal breath sounds. No wheezing or rales.  Musculoskeletal:     Cervical back: Neck supple.  Neurological:     Mental Status: He is alert and oriented to person, place, and time.     BP 120/74 (BP Location: Left Arm, Cuff Size: Normal)   Pulse 67   Temp 98 F (36.7 C) (Oral)   Ht 6\' 1"  (1.854 m)   Wt 195 lb 6.4 oz (88.6 kg)   SpO2 94%   BMI 25.78 kg/m  Wt Readings from Last 3 Encounters:  01/05/20 195 lb 6.4 oz (88.6 kg)  10/08/19 194 lb 3.2 oz (88.1 kg)  03/05/18 200 lb (90.7 kg)     Health Maintenance Due  Topic Date Due  . COVID-19 Vaccine (1) Never done  . HIV Screening  Never done  . INFLUENZA VACCINE  Never done    There are no preventive care reminders to display for this  patient.  Lab Results  Component Value Date   TSH 0.99 10/08/2019   Lab Results  Component Value Date   WBC 7.0 10/08/2019   HGB 14.3 10/08/2019   HCT 42.3 10/08/2019   MCV 88.7 10/08/2019   PLT 249  10/08/2019   Lab Results  Component Value Date   NA 141 10/08/2019   K 4.4 10/08/2019   CO2 30 10/08/2019   GLUCOSE 96 10/08/2019   BUN 13 10/08/2019   CREATININE 1.02 10/08/2019   BILITOT 0.5 10/08/2019   ALKPHOS 84 11/17/2017   AST 22 10/08/2019   ALT 17 10/08/2019   PROT 7.3 10/08/2019   ALBUMIN 4.2 11/17/2017   CALCIUM 9.1 10/08/2019   GFR 71.88 11/17/2017   Lab Results  Component Value Date   CHOL 207 (H) 10/08/2019   Lab Results  Component Value Date   HDL 73 10/08/2019   Lab Results  Component Value Date   LDLCALC 118 (H) 10/08/2019   Lab Results  Component Value Date   TRIG 64 10/08/2019   Lab Results  Component Value Date   CHOLHDL 2.8 10/08/2019   No results found for: HGBA1C    Assessment & Plan:   Problem List Items Addressed This Visit      Unprioritized   Amnestic MCI (mild cognitive impairment with memory loss)    Other Visit Diagnoses    B12 deficiency    -  Primary   Relevant Orders   Vitamin B12    -Recheck B level today.  If still low recommend intramuscular replacement -We will order MRI brain to further assess his cognitive impairment -If B12 now normal and MRI normal consider Aricept trial.  He has been somewhat reluctant but states he would consider this.  No orders of the defined types were placed in this encounter.   Follow-up: No follow-ups on file.    Carolann Littler, MD

## 2020-01-06 ENCOUNTER — Encounter: Payer: Self-pay | Admitting: Family Medicine

## 2020-01-06 ENCOUNTER — Other Ambulatory Visit: Payer: Managed Care, Other (non HMO)

## 2020-01-06 NOTE — Progress Notes (Signed)
Created letter as unable to reach pt via phone/thx dmf

## 2020-01-07 ENCOUNTER — Telehealth: Payer: Self-pay

## 2020-01-07 NOTE — Telephone Encounter (Signed)
Pt called and requested lab results.   Pt informed of b12 result per PCP.   Pt would like to do the b12 injects to see if that boosts his b12 faster than the tablets.   Please advise if request is approved.

## 2020-01-07 NOTE — Telephone Encounter (Signed)
Start B12 1000 mcg im weekly for 4 weeks and then resume oral Would still like to get follow-up B12 level in about 3 months

## 2020-01-10 NOTE — Telephone Encounter (Signed)
Left detailed message for patient informing of PCP approval for B12 injections for 4 weeks and then to start oral b12 after the 4th injection.   Please schedule nurse visits per PCP recommendation.

## 2020-01-11 ENCOUNTER — Other Ambulatory Visit: Payer: Self-pay

## 2020-01-11 ENCOUNTER — Ambulatory Visit (INDEPENDENT_AMBULATORY_CARE_PROVIDER_SITE_OTHER): Payer: No Typology Code available for payment source | Admitting: Family Medicine

## 2020-01-11 DIAGNOSIS — E538 Deficiency of other specified B group vitamins: Secondary | ICD-10-CM

## 2020-01-11 MED ORDER — CYANOCOBALAMIN 1000 MCG/ML IJ SOLN
1000.0000 ug | INTRAMUSCULAR | Status: AC
  Administered 2020-01-11 – 2020-01-26 (×3): 1000 ug via INTRAMUSCULAR

## 2020-01-11 NOTE — Telephone Encounter (Signed)
Pt came in today for nurse visit

## 2020-01-11 NOTE — Progress Notes (Signed)
Pt came in for nurse visit to have a b12 injection. Order entered for b12 injections weekly for 4 weeks.   Pt tolerated injections very well.   Agree with injection as given above  [name] MD [facility]

## 2020-01-18 ENCOUNTER — Other Ambulatory Visit: Payer: Self-pay

## 2020-01-18 ENCOUNTER — Ambulatory Visit (INDEPENDENT_AMBULATORY_CARE_PROVIDER_SITE_OTHER): Payer: No Typology Code available for payment source

## 2020-01-18 DIAGNOSIS — E538 Deficiency of other specified B group vitamins: Secondary | ICD-10-CM

## 2020-01-18 NOTE — Progress Notes (Signed)
Per orders of Dr. Elease Hashimoto, injection of Cyanocobalamin 1,000 mcg/mL given by Wyvonne Lenz. Patient tolerated injection well.

## 2020-01-25 ENCOUNTER — Ambulatory Visit: Payer: Self-pay

## 2020-01-26 ENCOUNTER — Other Ambulatory Visit: Payer: Self-pay

## 2020-01-26 ENCOUNTER — Ambulatory Visit (INDEPENDENT_AMBULATORY_CARE_PROVIDER_SITE_OTHER): Payer: No Typology Code available for payment source | Admitting: *Deleted

## 2020-01-26 DIAGNOSIS — E538 Deficiency of other specified B group vitamins: Secondary | ICD-10-CM

## 2020-01-26 NOTE — Progress Notes (Signed)
Patient in today for B-12 injection. Injection Administered in LD with no reaction

## 2020-01-27 ENCOUNTER — Ambulatory Visit
Admission: RE | Admit: 2020-01-27 | Discharge: 2020-01-27 | Disposition: A | Discharge status: Home / Self Care | Payer: No Typology Code available for payment source | Source: Ambulatory Visit | Attending: Family Medicine | Admitting: Family Medicine

## 2020-01-27 DIAGNOSIS — G3184 Mild cognitive impairment, so stated: Secondary | ICD-10-CM

## 2020-02-01 ENCOUNTER — Ambulatory Visit: Payer: Self-pay

## 2020-02-02 ENCOUNTER — Other Ambulatory Visit: Payer: Self-pay

## 2020-02-02 ENCOUNTER — Ambulatory Visit (INDEPENDENT_AMBULATORY_CARE_PROVIDER_SITE_OTHER): Payer: No Typology Code available for payment source | Admitting: *Deleted

## 2020-02-02 DIAGNOSIS — E538 Deficiency of other specified B group vitamins: Secondary | ICD-10-CM

## 2020-02-02 MED ORDER — CYANOCOBALAMIN 1000 MCG/ML IJ SOLN
1000.0000 ug | Freq: Once | INTRAMUSCULAR | Status: AC
  Administered 2020-02-02: 1000 ug via INTRAMUSCULAR

## 2020-02-02 NOTE — Progress Notes (Signed)
Pt came in as a walk in requesting Vit B12 injection. Pt was given a vit b 12 injection left deltoid successfully. W/o any concerns.

## 2020-02-03 ENCOUNTER — Telehealth: Payer: Self-pay | Admitting: Family Medicine

## 2020-02-03 NOTE — Telephone Encounter (Signed)
Return pt's call regarding  forms

## 2020-02-03 NOTE — Telephone Encounter (Signed)
Pt returning your call and want a call back. 

## 2020-02-15 ENCOUNTER — Telehealth: Payer: Self-pay | Admitting: Family Medicine

## 2020-02-15 NOTE — Telephone Encounter (Signed)
Patient was wondering when he needs to follow up from B12 treatment.  Appointment scheduled for him to discuss with Dr Elease Hashimoto.  Patient agrees.

## 2020-02-15 NOTE — Telephone Encounter (Signed)
Patient would like to speak with Dr. Elease Hashimoto about his B12 shots. CB: [phone]

## 2020-02-16 ENCOUNTER — Other Ambulatory Visit: Payer: Self-pay

## 2020-02-16 ENCOUNTER — Encounter: Payer: Self-pay | Admitting: Family Medicine

## 2020-02-16 ENCOUNTER — Ambulatory Visit: Payer: No Typology Code available for payment source | Admitting: Family Medicine

## 2020-02-16 VITALS — BP 134/92 | HR 72 | Ht 73.0 in | Wt 201.0 lb

## 2020-02-16 DIAGNOSIS — E538 Deficiency of other specified B group vitamins: Secondary | ICD-10-CM | POA: Diagnosis not present

## 2020-02-16 NOTE — Progress Notes (Signed)
Established Patient Office Visit  Subjective:  Patient ID: Paul Bird, male    DOB: [birth year]  Age: 59 y.o. MRN: [id number]  CC:  Chief Complaint  Patient presents with  . B12 def    HPI KYLAND NO presents for follow-up regarding B12 deficiency.  He had presented with some cognitive concerns several months ago.  We referred to neuropsychology and confirmed mild cognitive impairment.  In process of work-up he had low B12 162.  We tried oral replacement and this came up only marginally to 269.  At that point we started intramuscular replacement with 1000 mcg weekly.  He states he felt better overall and more clear with his mentation after starting the B12.  Is here now to get follow-up level.  We did get MRI of the brain October 28 which showed no acute abnormalities.  There have been some discussion regarding Aricept as a possibility but patient was not interested in pursuing that.  He continues to work full-time.  Past Medical History:  Diagnosis Date  . Actinic keratosis 04/19/2009   Qualifier: Diagnosis of  By: Joyce Gross    . Adenomatous polyp   . Cancer [facility])    melanoma -- neck, chest   . DYSESTHESIA 09/08/2009   Qualifier: Diagnosis of  By: Elease Hashimoto MD, Severa Jeremiah    . Erectile dysfunction   . 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 COLONOSCOPY    . melanoma removal  2019   neck and chest   . NECK SURGERY    . POLYPECTOMY    . ROTATOR CUFF REPAIR  2001   right    Family History  Problem Relation Age of Onset  . Heart disease Father 18       ?viral myocarditis  . Colon cancer Neg Hx   . Colon polyps Neg Hx   . Rectal cancer Neg Hx   . Ulcerative colitis Neg Hx   . Stomach cancer Neg Hx   . Esophageal cancer Neg Hx     Social History   Socioeconomic History  . Marital status: Divorced    Spouse name: Not on file  . Number of children: Not on  file  . Years of education: Not on file  . Highest education level: Not on file  Occupational History  . Not on file  Tobacco Use  . Smoking status: Never Smoker  . Smokeless tobacco: Never Used  Substance and Sexual Activity  . Alcohol use: Yes    Comment: once every 1-2 weeks  . Drug use: No  . Sexual activity: Not on file  Other Topics Concern  . Not on file  Social History Narrative  . Not on file   Social Determinants of Health   Financial Resource Strain:   . Difficulty of Paying Living Expenses: Not on file  Food Insecurity:   . Worried About Charity fundraiser in the Last Year: Not on file  . Ran Out of Food in the Last Year: Not on file  Transportation Needs:   . Lack of Transportation (Medical): Not on file  . Lack of Transportation (Non-Medical): Not on file  Physical Activity:   . Days of Exercise per Week: Not on file  . Minutes of Exercise per Session: Not on file  Stress:   . Feeling of Stress : Not on file  Social Connections:   .  Frequency of Communication with Friends and Family: Not on file  . Frequency of Social Gatherings with Friends and Family: Not on file  . Attends Religious Services: Not on file  . Active Member of Clubs or Organizations: Not on file  . Attends Archivist Meetings: Not on file  . Marital Status: Not on file  Intimate Partner Violence:   . Fear of Current or Ex-Partner: Not on file  . Emotionally Abused: Not on file  . Physically Abused: Not on file  . Sexually Abused: Not on file    Outpatient Medications Prior to Visit  Medication Sig Dispense Refill  . albuterol (VENTOLIN HFA) 108 (90 Base) MCG/ACT inhaler Inhale 2 puffs into the lungs every 4 (four) hours as needed for wheezing or shortness of breath. 18 g 0  . sildenafil (REVATIO) 20 MG tablet Take 2 to 5 tablets one hour prior to sexual activity 100 tablet 1   No facility-administered medications prior to visit.    No Known Allergies  ROS Review of  Systems  Constitutional: Negative for appetite change, fatigue and unexpected weight change.  Neurological: Negative for seizures, speech difficulty, weakness, numbness and headaches.  Psychiatric/Behavioral: Negative for confusion.      Objective:    Physical Exam Vitals reviewed.  Constitutional:      Appearance: Normal appearance.  Cardiovascular:     Rate and Rhythm: Normal rate and regular rhythm.  Neurological:     General: No focal deficit present.     Mental Status: He is alert.     Cranial Nerves: No cranial nerve deficit.  Psychiatric:        Mood and Affect: Mood normal.        Thought Content: Thought content normal.     BP (!) 134/92   Pulse 72   Ht 6\' 1"  (1.854 m)   Wt 201 lb (91.2 kg)   BMI 26.52 kg/m  Wt Readings from Last 3 Encounters:  02/16/20 201 lb (91.2 kg)  01/05/20 195 lb 6.4 oz (88.6 kg)  10/08/19 194 lb 3.2 oz (88.1 kg)     Health Maintenance Due  Topic Date Due  . COVID-19 Vaccine (1) Never done  . HIV Screening  Never done    There are no preventive care reminders to display for this patient.  Lab Results  Component Value Date   TSH 0.99 10/08/2019   Lab Results  Component Value Date   WBC 7.0 10/08/2019   HGB 14.3 10/08/2019   HCT 42.3 10/08/2019   MCV 88.7 10/08/2019   PLT 249 10/08/2019   Lab Results  Component Value Date   NA 141 10/08/2019   K 4.4 10/08/2019   CO2 30 10/08/2019   GLUCOSE 96 10/08/2019   BUN 13 10/08/2019   CREATININE 1.02 10/08/2019   BILITOT 0.5 10/08/2019   ALKPHOS 84 11/17/2017   AST 22 10/08/2019   ALT 17 10/08/2019   PROT 7.3 10/08/2019   ALBUMIN 4.2 11/17/2017   CALCIUM 9.1 10/08/2019   GFR 71.88 11/17/2017   Lab Results  Component Value Date   CHOL 207 (H) 10/08/2019   Lab Results  Component Value Date   HDL 73 10/08/2019   Lab Results  Component Value Date   LDLCALC 118 (H) 10/08/2019   Lab Results  Component Value Date   TRIG 64 10/08/2019   Lab Results  Component  Value Date   CHOLHDL 2.8 10/08/2019   No results found for: HGBA1C    Assessment &  Plan:   Problem List Items Addressed This Visit    None    Visit Diagnoses    Vitamin B 12 deficiency    -  Primary   Relevant Orders   Vitamin B12    Patient diagnosed with mild cognitive impairment after further neuro psychological testing last summer.  B12 deficiency and has received a full month of B12 injections.  -Repeat B12 level at this time.  If adequately replaced consider transitioning to 1 more trial of oral replacement.  No orders of the defined types were placed in this encounter.   Follow-up: No follow-ups on file.    Carolann Littler, MD

## 2020-02-16 NOTE — Patient Instructions (Signed)

## 2020-02-17 LAB — VITAMIN B12: Vitamin B-12: 339 pg/mL (ref 200–1100)

## 2020-02-18 ENCOUNTER — Telehealth: Payer: Self-pay

## 2020-02-18 NOTE — Telephone Encounter (Signed)
Left detailed message informing patient of results and recommendations. 

## 2020-02-18 NOTE — Telephone Encounter (Signed)
Patient is calling back and wanted to see what his exact numbers from his B12 labs, please advise. CB is [phone]

## 2020-02-18 NOTE — Telephone Encounter (Signed)
-----   Message from [name], MD sent at 02/17/2020  6:21 PM EST ----- B12 has improved- but only slightly.  I would continue with B12 1,000 mcg IM every 2 weeks for another 2 months and then monthly.

## 2020-02-18 NOTE — Telephone Encounter (Signed)
Patient aware of results.

## 2020-02-22 ENCOUNTER — Ambulatory Visit (INDEPENDENT_AMBULATORY_CARE_PROVIDER_SITE_OTHER): Payer: No Typology Code available for payment source

## 2020-02-22 ENCOUNTER — Other Ambulatory Visit: Payer: Self-pay

## 2020-02-22 DIAGNOSIS — E538 Deficiency of other specified B group vitamins: Secondary | ICD-10-CM | POA: Diagnosis not present

## 2020-02-22 MED ORDER — CYANOCOBALAMIN 1000 MCG/ML IJ SOLN
1000.0000 ug | Freq: Once | INTRAMUSCULAR | Status: AC
  Administered 2020-02-22: 1000 ug via INTRAMUSCULAR

## 2020-02-22 NOTE — Progress Notes (Signed)
Per orders of Dr. Elease Hashimoto, injection of Cyanocobalamin 1,000 mcg/mL  given by Wyvonne Lenz. Patient tolerated injection well.

## 2020-03-08 ENCOUNTER — Other Ambulatory Visit: Payer: Self-pay

## 2020-03-08 ENCOUNTER — Ambulatory Visit (INDEPENDENT_AMBULATORY_CARE_PROVIDER_SITE_OTHER): Payer: No Typology Code available for payment source | Admitting: Family Medicine

## 2020-03-08 DIAGNOSIS — E538 Deficiency of other specified B group vitamins: Secondary | ICD-10-CM | POA: Diagnosis not present

## 2020-03-08 MED ORDER — CYANOCOBALAMIN 1000 MCG/ML IJ SOLN
1000.0000 ug | Freq: Once | INTRAMUSCULAR | Status: AC
  Administered 2020-03-08: 1000 ug via INTRAMUSCULAR

## 2020-03-08 NOTE — Progress Notes (Signed)
Patient seen today for B-12 injection.  Injection given in left deltoid.  Patient tolerated well.

## 2020-03-21 ENCOUNTER — Ambulatory Visit (INDEPENDENT_AMBULATORY_CARE_PROVIDER_SITE_OTHER): Payer: No Typology Code available for payment source

## 2020-03-21 ENCOUNTER — Other Ambulatory Visit: Payer: Self-pay

## 2020-03-21 DIAGNOSIS — E538 Deficiency of other specified B group vitamins: Secondary | ICD-10-CM | POA: Diagnosis not present

## 2020-03-21 MED ORDER — CYANOCOBALAMIN 1000 MCG/ML IJ SOLN
1000.0000 ug | Freq: Once | INTRAMUSCULAR | Status: AC
  Administered 2020-03-21: 09:00:00 1000 ug via INTRAMUSCULAR

## 2020-03-21 NOTE — Progress Notes (Signed)
Patient seen for B12 injection.  Tolerated well.

## 2020-04-04 ENCOUNTER — Ambulatory Visit (INDEPENDENT_AMBULATORY_CARE_PROVIDER_SITE_OTHER): Payer: No Typology Code available for payment source

## 2020-04-04 ENCOUNTER — Other Ambulatory Visit: Payer: Self-pay

## 2020-04-04 DIAGNOSIS — E538 Deficiency of other specified B group vitamins: Secondary | ICD-10-CM

## 2020-04-04 MED ORDER — CYANOCOBALAMIN 1000 MCG/ML IJ SOLN
1000.0000 ug | Freq: Once | INTRAMUSCULAR | Status: AC
  Administered 2020-04-04: 1000 ug via INTRAMUSCULAR

## 2020-04-04 NOTE — Progress Notes (Signed)
B12 injection given today. Patient tolerated well.

## 2020-05-02 ENCOUNTER — Ambulatory Visit (INDEPENDENT_AMBULATORY_CARE_PROVIDER_SITE_OTHER): Payer: No Typology Code available for payment source

## 2020-05-02 ENCOUNTER — Other Ambulatory Visit: Payer: Self-pay

## 2020-05-02 DIAGNOSIS — E538 Deficiency of other specified B group vitamins: Secondary | ICD-10-CM

## 2020-05-02 MED ORDER — CYANOCOBALAMIN 1000 MCG/ML IJ SOLN
1000.0000 ug | Freq: Once | INTRAMUSCULAR | Status: AC
  Administered 2020-05-02: 1000 ug via INTRAMUSCULAR

## 2020-05-02 NOTE — Progress Notes (Addendum)
Patient seen today for B12 injection.  Patient tolerated well.  Agree with injection as given.  [name] MD [facility]

## 2020-05-16 ENCOUNTER — Other Ambulatory Visit: Payer: Self-pay

## 2020-05-16 ENCOUNTER — Ambulatory Visit (INDEPENDENT_AMBULATORY_CARE_PROVIDER_SITE_OTHER): Payer: No Typology Code available for payment source | Admitting: *Deleted

## 2020-05-16 DIAGNOSIS — E538 Deficiency of other specified B group vitamins: Secondary | ICD-10-CM | POA: Diagnosis not present

## 2020-05-16 MED ORDER — CYANOCOBALAMIN 1000 MCG/ML IJ SOLN: 1000.0000 ug | Freq: Once | INTRAMUSCULAR | 0 refills | Status: DC

## 2020-05-16 MED ORDER — CYANOCOBALAMIN 1000 MCG/ML IJ SOLN
1000.0000 ug | Freq: Once | INTRAMUSCULAR | Status: AC
  Administered 2020-05-16: 1000 ug via INTRAMUSCULAR

## 2020-05-16 NOTE — Progress Notes (Signed)
Per orders of Dr. Burchette, injection of Cyanocobalamin 1000mcg given by Rocco Kerkhoff A. Patient tolerated injection well.  

## 2020-05-30 ENCOUNTER — Ambulatory Visit (INDEPENDENT_AMBULATORY_CARE_PROVIDER_SITE_OTHER): Payer: No Typology Code available for payment source | Admitting: *Deleted

## 2020-05-30 ENCOUNTER — Ambulatory Visit: Payer: No Typology Code available for payment source

## 2020-05-30 ENCOUNTER — Other Ambulatory Visit: Payer: Self-pay

## 2020-05-30 DIAGNOSIS — E538 Deficiency of other specified B group vitamins: Secondary | ICD-10-CM

## 2020-05-30 MED ORDER — CYANOCOBALAMIN 1000 MCG/ML IJ SOLN
1000.0000 ug | Freq: Once | INTRAMUSCULAR | Status: AC
  Administered 2020-05-30: 1000 ug via INTRAMUSCULAR

## 2020-05-30 NOTE — Progress Notes (Signed)
Per orders of Dr. Burchette, injection of Cyanocobalamin 1000mcg given by Funderburk, Jo A. Patient tolerated injection well.  

## 2020-06-11 ENCOUNTER — Other Ambulatory Visit: Payer: Self-pay | Admitting: Family Medicine

## 2020-06-11 DIAGNOSIS — E538 Deficiency of other specified B group vitamins: Secondary | ICD-10-CM

## 2020-06-12 NOTE — Telephone Encounter (Signed)
Okay to refill.  Would offer sending in 10 mL vial if he would prefer

## 2020-06-28 ENCOUNTER — Other Ambulatory Visit: Payer: Self-pay

## 2020-06-29 ENCOUNTER — Ambulatory Visit (INDEPENDENT_AMBULATORY_CARE_PROVIDER_SITE_OTHER): Payer: No Typology Code available for payment source

## 2020-06-29 DIAGNOSIS — E538 Deficiency of other specified B group vitamins: Secondary | ICD-10-CM

## 2020-06-29 MED ORDER — CYANOCOBALAMIN 1000 MCG/ML IJ SOLN
1000.0000 ug | Freq: Once | INTRAMUSCULAR | Status: AC
  Administered 2020-06-29: 1000 ug via INTRAMUSCULAR

## 2020-06-29 NOTE — Progress Notes (Signed)
Per orders of Dr. Elease Hashimoto, injection of B12 given by Rebecca Eaton. Patient tolerated injection well.

## 2021-08-20 IMAGING — MR MR HEAD W/O CM
10 series · 48 of 48 positions shown · non-contrast
Comparison: None.

CLINICAL DATA: Memory loss.

EXAM:
MRI HEAD WITHOUT CONTRAST
TECHNIQUE: Multiplanar, multiecho pulse sequences of the brain and surrounding
structures were obtained without intravenous contrast.

[Series 2: t1_se_sag · sagittal · 5.0mm · 0.45mm/px · 3 of 23 slices shown]
[im 1/23]
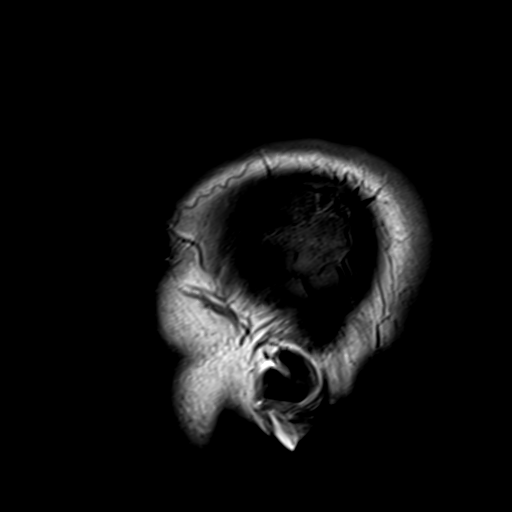
[im 12/23]
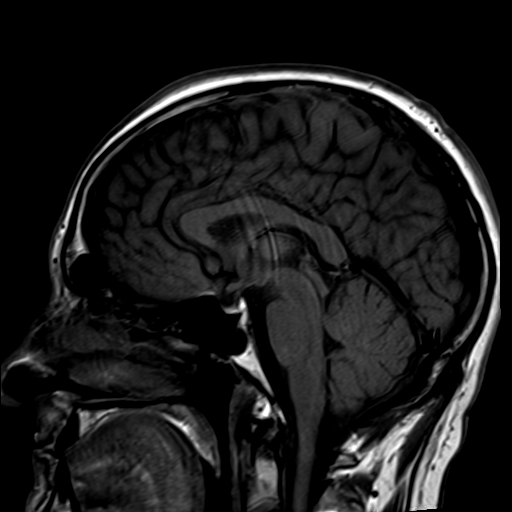
[im 23/23]
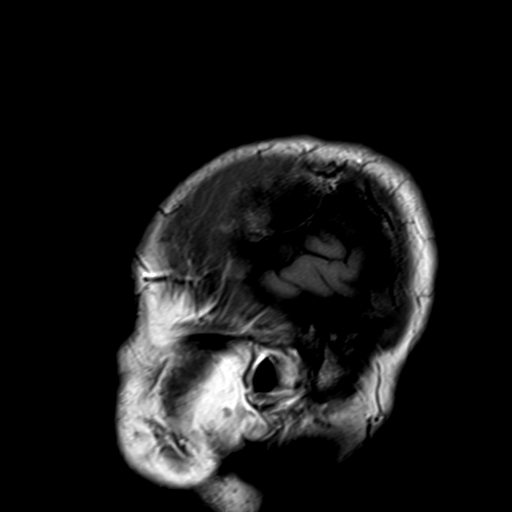

[Series 3: ep2d_diff_3 · axial · 3.0mm · 1.80mm/px · z∈[-65,+81]mm · 9 of 99 slices shown]
[im 1/99]
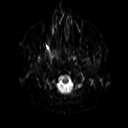
[im 13/99]
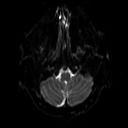
[im 25/99]
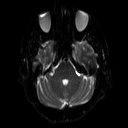
[im 37/99]
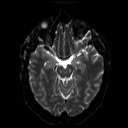
[im 50/99]
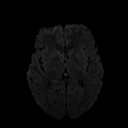
[im 62/99]
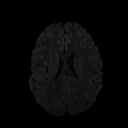
[im 74/99]
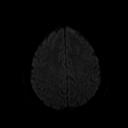
[im 86/99]
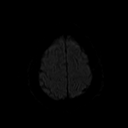
[im 99/99]
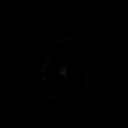

[Series 4: ep2d_diff_3_adc · axial · 3.0mm · 1.80mm/px · z∈[-65,+81]mm · 4 of 50 slices shown]
[im 1/50]
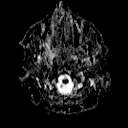
[im 17/50]
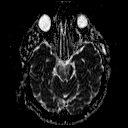
[im 33/50]
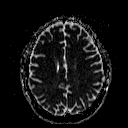
[im 50/50]
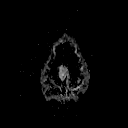

[Series 5: ep2d_diff_cor · coronal · 5.0mm · 1.77mm/px · 5 of 54 slices shown]
[im 1/54]
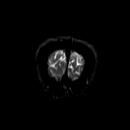
[im 14/54]
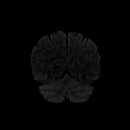
[im 27/54]
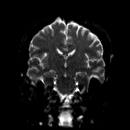
[im 40/54]
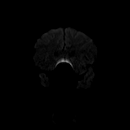
[im 54/54]
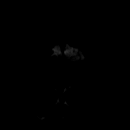

[Series 6: ep2d_diff_cor_adc · coronal · 5.0mm · 1.77mm/px · 2 of 27 slices shown]
[im 1/27]
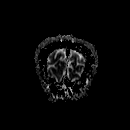
[im 27/27]
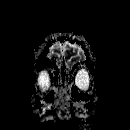

[Series 8: swi_images · axial · 2.0mm · 0.90mm/px · z∈[-71,+86]mm · 7 of 80 slices shown]
[im 1/80]
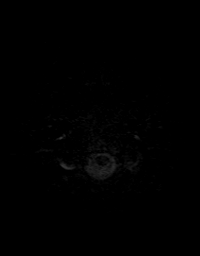
[im 14/80]
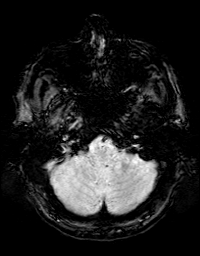
[im 27/80]
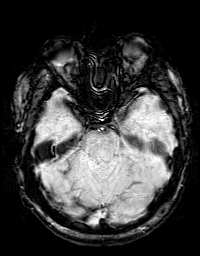
[im 40/80]
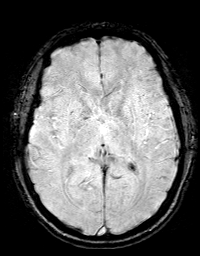
[im 53/80]
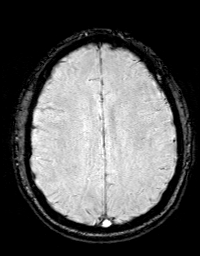
[im 66/80]
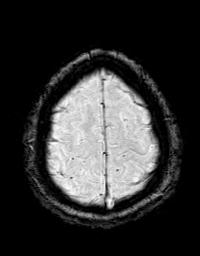
[im 80/80]
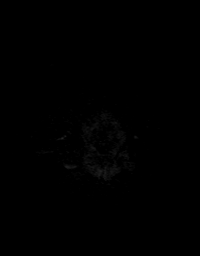

[Series 9: FLAIR · axial · 3.0mm · 0.43mm/px · z∈[-73,+87]mm · 2 of 28 slices shown]
[im 1/28]
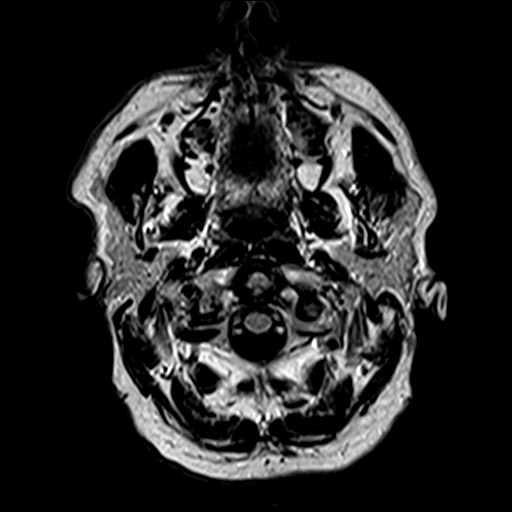
[im 28/28]
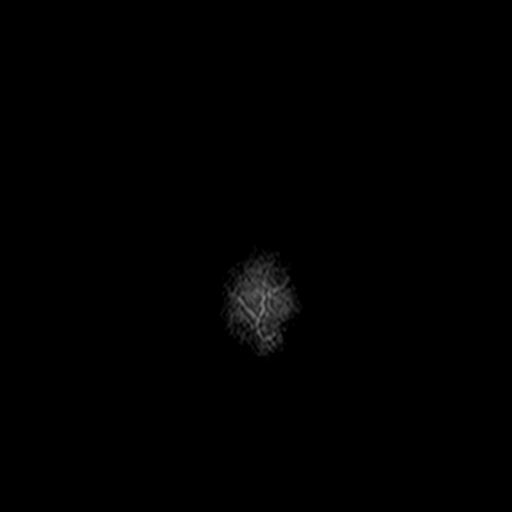

[Series 10: t2_tse_tra_512 · axial · 5.0mm · 0.72mm/px · z∈[-70,+85]mm · 2 of 27 slices shown]
[im 1/27]
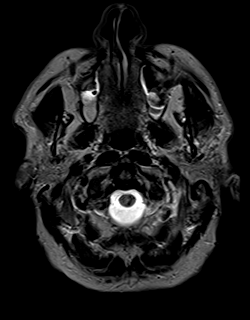
[im 27/27]
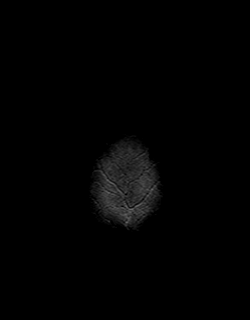

[Series 11: t1_mpr_tra · axial · 1.0mm · 0.72mm/px · z∈[-63,+78]mm · 12 of 144 slices shown]
[im 1/144]
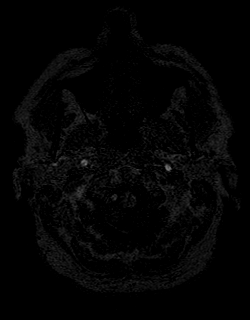
[im 14/144]
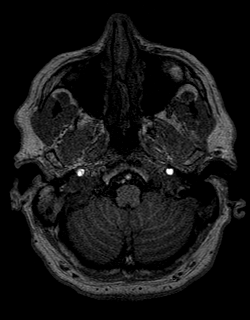
[im 27/144]
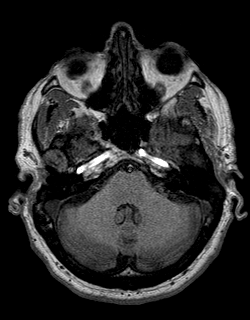
[im 40/144]
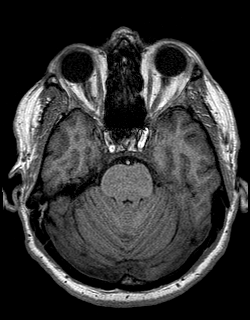
[im 53/144]
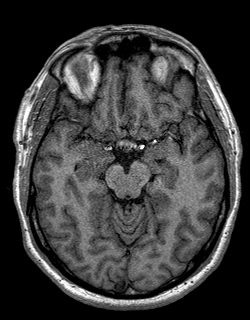
[im 66/144]
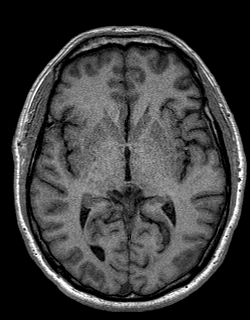
[im 79/144]
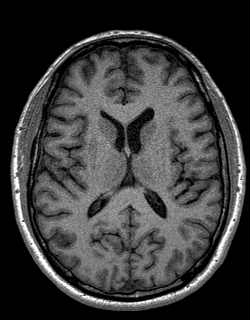
[im 92/144]
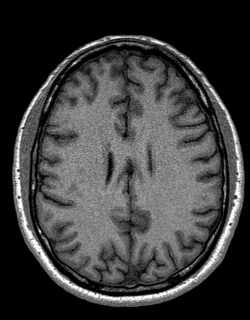
[im 105/144]
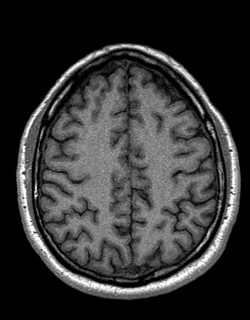
[im 118/144]
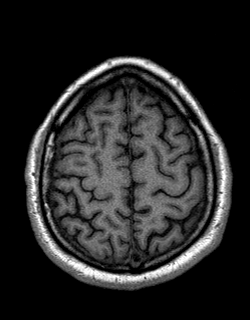
[im 131/144]
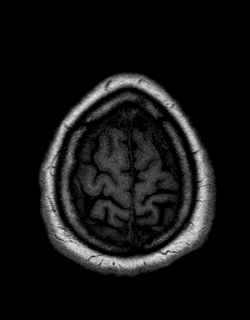
[im 144/144]
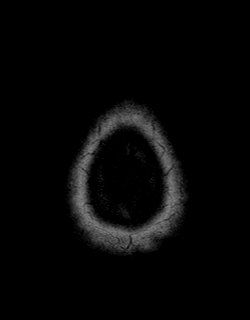

[Series 12: T2 · coronal · 5.0mm · 0.45mm/px · 2 of 28 slices shown]
[im 1/28]
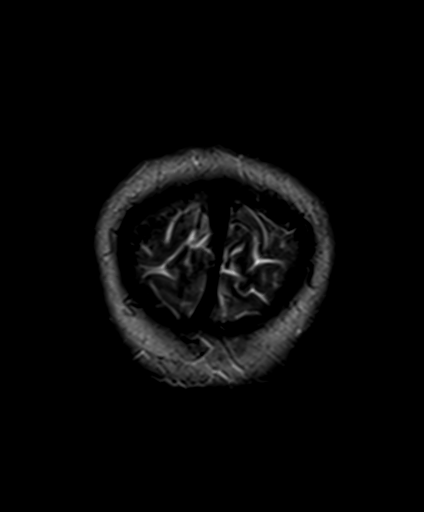
[im 28/28]
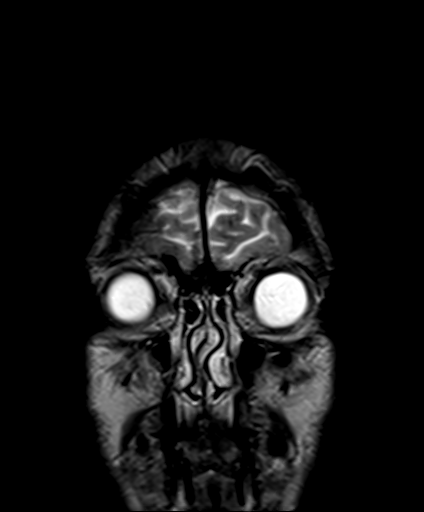

[48 of 48 positions shown; findings below may reference images not displayed]

FINDINGS: Brain: No acute infarction, hemorrhage, hydrocephalus, extra-axial
collection or mass lesion. The brain parenchyma has normal
morphology and signal characteristics. No significant parenchymal
volume loss.

Vascular: Normal flow voids.

Skull and upper cervical spine: Normal marrow signal.

Sinuses/Orbits: Bilateral lens surgery. Mild mucosal thickening of
the bilateral maxillary sinuses.

Other: None.
IMPRESSION: Unremarkable MRI of the brain.

## 2021-09-12 ENCOUNTER — Telehealth: Payer: Self-pay | Admitting: Family Medicine

## 2021-09-12 NOTE — Telephone Encounter (Signed)
Pt informed of the results and verbalized understanding. Appt scheduled

## 2021-09-12 NOTE — Telephone Encounter (Signed)
[place] tick bites on back of leg last week, feels tight, reddish, bullseye rash around bite, warm to touch. Wants to know if there is something he could take to help. Offered ov, opted for note

## 2021-09-12 NOTE — Telephone Encounter (Signed)
Pt declined visit with another provider tomorrow as he stated he only want to see PCP

## 2021-09-14 ENCOUNTER — Ambulatory Visit: Payer: No Typology Code available for payment source | Admitting: Family Medicine

## 2021-09-14 ENCOUNTER — Encounter: Payer: Self-pay | Admitting: Family Medicine

## 2021-09-14 VITALS — BP 116/68 | HR 70 | Temp 97.9°F | Ht 73.0 in | Wt 194.7 lb

## 2021-09-14 DIAGNOSIS — W57XXXA Bitten or stung by nonvenomous insect and other nonvenomous arthropods, initial encounter: Secondary | ICD-10-CM | POA: Diagnosis not present

## 2021-09-14 DIAGNOSIS — S70362A Insect bite (nonvenomous), left thigh, initial encounter: Secondary | ICD-10-CM

## 2021-09-14 NOTE — Progress Notes (Signed)
   Established Patient Office Visit  Subjective   Patient ID: Paul Bird, male    DOB: 03-13-1961  Age: 61 y.o. MRN: [id number]  Chief Complaint  Patient presents with   Insect Bite    Patient complains of tick bite, x1 week     HPI   Patient seen with couple of recent tick bites.  He states he had a small white dot on the back which sounds like a [place] tick.  He had both bites left upper thigh region.  He has no fever, headaches, or other skin rash.  He initially described that he had a "target "lesion but is actually no evidence for target lesion on exam.  He has about a 1 cm area of surrounding erythema.  He feels like he was able to remove the tick entirely.  He feels well otherwise.  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 He has never used smokeless tobacco. He reports current alcohol use. He reports that he does not use drugs. family history includes Heart disease (age of onset: 36) in his father. No Known Allergies  Review of Systems  Constitutional:  Negative for chills and fever.  Musculoskeletal:  Negative for joint pain.  Skin:  Negative for rash.  Neurological:  Negative for headaches.      Objective:     BP 116/68 (BP Location: Left Arm, Patient Position: Sitting, Cuff Size: Normal)   Pulse 70   Temp 97.9 F (36.6 C) (Oral)   Ht '6\' 1"'$  (1.854 m)   Wt 194 lb 11.2 oz (88.3 kg)   SpO2 98%   BMI 25.69  kg/m    Physical Exam Vitals reviewed.  Constitutional:      Appearance: Normal appearance.  Cardiovascular:     Rate and Rhythm: Normal rate and regular rhythm.  Skin:    Comments: Left upper medial thigh reveals couple of punctate areas surrounding by about 1 cm zone of erythema.  No concentric rash.  Neurological:     Mental Status: He is alert.      No results found for any visits on 09/14/21.    The 10-year ASCVD risk score (Arnett DK, et al., 2019) is: 5.7%    Assessment & Plan:   Tick bites with local allergic reaction.  No evidence for erythema migrans.  He does not have any worrisome symptoms such as fever, headache, arthralgia  -Tick bite information provided -Follow-up promptly for any progressive rash, fever, headache  No follow-ups on file.    Carolann Littler, MD

## 2022-04-18 ENCOUNTER — Encounter: Payer: Self-pay | Admitting: Psychology

## 2022-04-19 ENCOUNTER — Ambulatory Visit (INDEPENDENT_AMBULATORY_CARE_PROVIDER_SITE_OTHER): Payer: No Typology Code available for payment source | Admitting: Psychology

## 2022-04-19 ENCOUNTER — Encounter: Payer: Self-pay | Admitting: Psychology

## 2022-04-19 ENCOUNTER — Ambulatory Visit: Payer: No Typology Code available for payment source | Admitting: Psychology

## 2022-04-19 DIAGNOSIS — E538 Deficiency of other specified B group vitamins: Secondary | ICD-10-CM | POA: Insufficient documentation

## 2022-04-19 DIAGNOSIS — G3184 Mild cognitive impairment, so stated: Secondary | ICD-10-CM | POA: Diagnosis not present

## 2022-04-19 DIAGNOSIS — R41840 Attention and concentration deficit: Secondary | ICD-10-CM

## 2022-04-19 DIAGNOSIS — R4189 Other symptoms and signs involving cognitive functions and awareness: Secondary | ICD-10-CM

## 2022-04-19 HISTORY — DX: Mild cognitive impairment of uncertain or unknown etiology: G31.84

## 2022-04-19 NOTE — Progress Notes (Signed)
NEUROPSYCHOLOGICAL EVALUATION [facility] Neurology  Date of Evaluation: April 19, 2022  Reason for Referral:   Paul Bird is a 62 y.o. ambidextrous (writes with left hand) Caucasian male referred by  Paul Bird, M.D. , to characterize his current cognitive functioning and assist with diagnostic clarity and treatment planning in the context of subjective cognitive decline and prior concerns surrounding an amnestic MCI presentation.   Assessment and Plan:   Clinical Impression(s): Paul Bird' pattern of performance is suggestive of an isolated impairment surrounding complex attention and concentration. A further weakness was exhibited across delayed retrieval aspects of both verbal and visual memory; however, there was some variability amongst these scores rather than consistent deficits. Performance across all other assessed cognitive domains were appropriate relative to age-matched peers. This includes processing speed, basic attention, executive functioning, receptive and expressive language, visuospatial abilities, and both encoding and recognition aspects of memory. 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  Relative to his previous evaluation in September 2021, a notable improvement was exhibited across semantic fluency. Specific to memory, improvements were exhibited across a story learning task in general, as well as a list learning recognition trial. Other aspects of memory exhibited stability, along with the majority of other performances across the total evaluation. Only complex attention exhibited a decline relative to previous testing.   The etiology for ongoing memory dysfunction continues to be  unclear. Relative to prior concerns expressed surrounding Alzheimer's disease, I am encouraged by the current results. Despite an overall impairment retrieving information from memory storage upon demand, Paul Bird was able to demonstrate intact memory storage based upon consistently appropriate memory recognition performances. His overall improvement across a story-based task, as well as semantic fluency, currently bodes well as this does not align with the typical trajectory of this illness. His age would also be extremely young relative to when this sort of illness typically presents. Overall, while it is important to highlight that this illness still remains a possibility and I cannot rule it out, current testing also does not allow me to rule it in with confidence.   Medically, his ADHD is untreated. 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 There remains the potential that current objective and subjective deficits are related to the combination of these factors. Continued monitoring will be important moving forward to re-assess cognition and better understand his true trajectory.   Recommendations: A repeat neuropsychological evaluation in 24 months (or sooner if functional decline is noted) is recommended to assess the trajectory of future cognitive decline should it occur. This will also aid in future efforts towards improved diagnostic clarity.  Symptoms of ADHD are currently untreated and records suggest a prior Vyvanse trial in the past. I would recommend that he actively treat this condition and monitor for potential improvements in sustained attention and memory. If interested, he should discuss medication options with his PCP.  Ongoing treatment for his B12 deficiency will be important moving forward. Even if  injections do not seem to provide the same degree of improvement relative to the average individual, any improvements in his overall B12 levels  will be important for his physical and neurological health. I was unable to locate any labs performed since November 2021. I would recommend that he meet with his PCP to perform updated bloodwork.  In addressing ongoing concerns for the presence of a neurodegenerative illness which may be impacting memory performances (i.e., Alzheimer's disease), he could discuss the pros and cons of a lumbar puncture with his PCP as this procedure can provide useful information surrounding protein abnormalities or the lack thereof which are suggestive of this illness.   Should there be a progression of current deficits over time, he is unlikely to regain any independent living skills lost. Therefore, it is recommended that he remain as involved as possible in all aspects of household chores, finances, and medication management, with supervision to ensure adequate performance. He will likely benefit from the establishment and maintenance of a routine in order to maximize functional abilities over time.  Paul Bird is encouraged to attend to lifestyle factors for brain health (e.g., regular physical exercise, good nutrition habits, regular participation in cognitively-stimulating activities, and general stress management techniques), which are likely to have benefits for both emotional adjustment and cognition. Optimal control of vascular risk factors (including safe cardiovascular exercise and adherence to dietary recommendations) is encouraged. Continued participation in activities which provide mental stimulation and social interaction is also recommended.   Memory can be improved using internal strategies such as rehearsal, repetition, chunking, mnemonics, association, and imagery. External strategies such as written notes in a consistently used memory journal, visual and  nonverbal auditory cues such as a calendar on the refrigerator or appointments with alarm, such as on a cell phone, can also help maximize recall.    Because he shows better recall for structured information, he will likely understand and retain new information better if it is presented to him in a meaningful or well-organized manner at the outset, such as grouping items into meaningful categories or presenting information in an outlined, bulleted, or story format.   To address problems with fluctuating attention, he may wish to consider:   -Avoiding external distractions when needing to concentrate   -Limiting exposure to fast paced environments with multiple sensory demands   -Writing down complicated information and using checklists   -Attempting and completing one task at a time (i.e., no multi-tasking)   -Verbalizing aloud each step of a task to maintain focus   -Taking frequent breaks during the completion of steps/tasks to avoid fatigue   -Reducing the amount of information considered at one time  Review of Records:   Mr. Becvar completed a previous neuropsychological evaluation Alphonzo Severance, Psy.D.) on 12/16/2019. At that time, results suggested fairly prominent memory impairment, with a particular weakness surrounding verbal tasks. Concern was expressed for an evolving storage impairment. An additional weakness was exhibited across semantic fluency, along with some further variability across visuospatial tasks. 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 There was a discussion surrounding the implications of his history of ADHD and low B12 levels impacting cognitive performances. However, Dr. Nicole Kindred did express concerns surrounding Alzheimer's disease given that memory impairment was worried to be above and beyond these contributions alone. Repeat testing was recommended.   Brain MRI on 01/27/2020 was unremarkable.   He was seen by his PCP Paul Bird, M.D.) on 02/16/2020 for follow-up. Labs suggested notably diminished B12 levels (162). Oral replacement had been marginally beneficial, raising levels to 269. Due to this, he was started on regular B12 injections as his primary  treatment. There was reportedly discussion surrounding the initiation of a memory medication (i.e., Aricept). However, Mr. Kost was not interested in that medication at that time. He continued to receive regular B12 injections over the subsequent months. However, his most recent injection based on what is in his medical records was performed on 06/29/2020. I was unable to locate labs or measured B12 levels more recent than 02/16/2020.   Ultimately, Mr. Ruppert was referred for a repeat neuropsychological evaluation to characterize his cognitive abilities and to assist with diagnostic clarity and treatment planning.   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   Dysesthesia 09/08/2009   Erectile dysfunction 04/19/2009   Qualifier: Diagnosis of   By: Joyce Gross       Melanoma of skin 11/19/2017   Clark's stage I, depth 0.2 mm   Vitamin B12 deficiency     Past Surgical History:  Procedure Laterality Date   COLONOSCOPY     melanoma removal  2019   neck and chest    NECK SURGERY     POLYPECTOMY     ROTATOR CUFF REPAIR  2001   right    Current Outpatient Medications:    mupirocin ointment (BACTROBAN) 2 %, Apply topically daily., Disp: , Rfl:    prednisoLONE acetate (PRED FORTE) 1 % ophthalmic suspension, Place 1 drop into the left eye 4 (four) times daily., Disp: , Rfl:    XDEMVY 0.25 % SOLN, Apply to eye., Disp: , Rfl:   Clinical Interview:   The following information was obtained during a clinical interview with Mr. Tisdale and his wife prior to cognitive  testing.  Cognitive Symptoms: Decreased short-term memory: Endorsed. Primary examples including trouble recalling recent conversations, trouble recalling names of even familiar individuals, and navigational concerns. He wife noted rapid forgetting where he seems to lose information in a matter of minutes. Per her perception, memory decline has been gradually progressive since his previous neuropsychological evaluation in September 2021.  Decreased long-term memory: Denied. Decreased attention/concentration: Endorsed. He was diagnosed with ADHD as an adult and described longstanding difficulties with distractibility and sustained attention and focus. These difficulties have persisted to present day. He is not currently taking any stimulant medications to treat this condition.  Reduced processing speed: Denied. Difficulties with executive functions: Endorsed. He acknowledged diminished abilities surrounding multi-tasking. His wife was in agreement, noting that this is particularly prominent whenever he becomes stressed. They denied trouble with impulsivity or any severe personality changes.  Difficulties with emotion regulation: Denied. His wife did note that Mr. Ates has been much quicker to become angry or irritated lately, which represents a change from him typically being very easy going and mild-mannered. This was attributed to frustration surrounding memory decline.  Difficulties with receptive language: Denied. Difficulties with word finding: Endorsed. Decreased visuoperceptual ability: Denied.  Difficulties completing ADLs: Somewhat. His wife manages finances and bill paying which is longstanding in nature. He appears to have some trouble with medication management and his wife noted that she must initiate these tasks and provide regular reminders. He continues to drive but has become more GPS reliant as navigational concerns have become more prominent. 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   Additional Medical  History: History of traumatic brain injury/concussion: Denied. History of stroke: Denied. History of seizure activity: Denied. History of known exposure to toxins: Denied. Symptoms of chronic pain: Denied. Experience of frequent headaches/migraines: Denied. Frequent instances of dizziness/vertigo: Denied.  Sensory changes: Denied.  Balance/coordination difficulties: Denied. He also denied any recent falls. Other motor difficulties: Denied.  Other medical conditions: His history of a vitamin B12 deficiency is described above. He reported that even with regular injections, B12 levels were unable to reach levels within normal limits. He reported starting NAD+ B12 injections over the past several weeks. He did not report experiencing an obvious benefit as of yet.   Sleep History: Estimated hours obtained each night: 7-8 hours.  Difficulties falling asleep: Denied. Difficulties staying asleep: Denied. Feels rested and refreshed upon awakening: Endorsed.  History of snoring: Denied. History of waking up gasping for air: Denied. Witnessed breath cessation while asleep: Denied.  History of vivid dreaming: Denied. Excessive movement while asleep: Denied. Instances of acting out his dreams: Denied.  Psychiatric/Behavioral Health History: Depression: He described his mood as being positive overall and denied a history of mental health concerns or diagnoses. Current or remote suicidal ideation, intent, or plan was denied.  Anxiety: Denied. Mania: Denied. Trauma History: Denied. Visual/auditory hallucinations: Denied. Delusional thoughts: Denied.  Tobacco: Denied. Alcohol: On average, he reported maybe consuming three glasses of wine weekly. He denied a history of problematic alcohol abuse or dependence.   Recreational drugs: Denied.  Family History: Problem Relation Age of Onset   Dementia Mother        69s   Memory loss Mother    Heart disease Father 7       ?viral myocarditis   Colon cancer Neg Hx    Colon polyps Neg Hx    Rectal cancer Neg Hx    Ulcerative colitis Neg Hx    Stomach cancer Neg Hx    Esophageal cancer Neg Hx    This information was confirmed by Mr. Faciane.  Academic/Vocational History: Highest level of educational attainment: 12 years. He graduated from high school and described himself as a strong (mostly A) student in academic settings. No relative weaknesses were identified. After high school, he attended community college and completed several vocational course related to the [employer]. He did not earn a formal college degree.  History of developmental delay: Denied. History of grade repetition: Denied. Enrollment in special education courses: Denied. History of LD: Denied.  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 He left [employer] and has worked for [employer] for the past 11 years. He has maintained full-time employment since. 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   Evaluation Results:   Behavioral Observations: Mr. Swoveland was accompanied by his wife, arrived to his appointment on time, and was appropriately dressed and groomed. He appeared alert and oriented. Observed gait and station were within normal limits. Gross motor functioning appeared intact upon informal observation and no abnormal movements (e.g., tremors) were noted. His affect was generally relaxed and positive. Spontaneous speech was fluent and word finding difficulties were not observed during the clinical interview. Thought processes were coherent, organized, and normal in content. Insight into his cognitive difficulties appeared adequate.    During testing, sustained attention was appropriate. Task engagement was adequate and he persisted when challenged. Overall, Mr. Kirk was cooperative with the clinical interview and subsequent testing procedures.   Adequacy  of Effort: The validity of neuropsychological testing is limited by the extent to which the individual being tested may be assumed to have exerted adequate effort during testing. Mr. Mabe expressed his intention to perform to the best of his abilities and exhibited adequate task engagement and persistence. Scores across stand-alone and embedded performance validity measures were within expectation. As such, the results of the current evaluation are believed to be a valid representation of Mr. Ricketts current cognitive functioning.  Test Results: Mr. Depoy was fully oriented at the time of the current evaluation.  Intellectual abilities based upon educational and vocational attainment were estimated to be in the average range. Premorbid abilities were estimated to be within the average range based upon a single-word reading test.   Processing speed was average to well above average. Basic attention was average. More complex attention (e.g., working memory) was well below average. Executive functioning was average to well above average.  Assessed receptive language abilities were above average. Likewise, Mr. Madole did not exhibit any difficulties comprehending task instructions and answered all questions asked of him appropriately. Assessed expressive language (e.g., verbal fluency and confrontation naming) was average to well above average.     Assessed visuospatial/visuoconstructional abilities were below average to average. Points were lost on his copy of a complex figure largely due to a sloppy approach and numerous mild visual distortions.     Learning (i.e., encoding) of novel verbal and visual information was below average to average. Spontaneous delayed  recall (i.e., retrieval) of previously learned information was variable, ranging from the well below average to average normative ranges. Retention rates were 93% across a story learning task, 33% across a list learning task, 53% across a daily living task, and 80% across a shape learning task. Performance across recognition tasks was below average to average, suggesting some evidence for information consolidation.   Results of emotional screening instruments suggested that recent symptoms of generalized anxiety were in the minimal range, while symptoms of depression were within normal limits. A screening instrument assessing recent sleep quality suggested the presence of minimal sleep dysfunction.  Tables of Scores:   Note: This summary of test scores accompanies the interpretive report and should not be considered in isolation without reference to the appropriate sections in the text. Descriptors are based on appropriate normative data and may be adjusted based on clinical judgment. Terms such as "Within Normal Limits" and "Outside Normal Limits" are used when a more specific description of the test score cannot be determined. Descriptors refer to the current evaluation only.         Percentile - Normative Descriptor > 98 - Exceptionally High 91-97 - Well Above Average 75-90 - Above Average 25-74 - Average 9-24 - Below Average 2-8 - Well Below Average < 2 - Exceptionally Low         September 2021 Current    Orientation:       Raw Score Raw Score Percentile   NAB Orientation, Form 1 --- 29/29 --- ---        Cognitive Screening:       Raw Score Raw Score Percentile   SLUMS: --- 19/30 --- ---        Intellectual Functioning:       Standard Score Standard Score Percentile   Test of Premorbid Functioning: --- 95 37 Average        Memory:      NAB Memory Module, Form 1: Standard Score/ T Score Standard Score/ T Score Percentile  Total Memory Index 67 78 7 Well Below Average  List  Learning        Total Trials 1-3 11/36 (27) 14/36 (38) 12 Below Average    List B 2/12 (34) 3/12 (45) 31 Average    Short Delay Free Recall 3/12 (30) 3/12 (35) 7 Well Below Average    Long Delay Free Recall 2/12 (28) 1/12 (31) 3 Well Below Average    Retention Percentage 67 33 (24) <1 Exceptionally Low    Recognition Discriminability -4 5 (41) 18 Below Average  Shape Learning        Total Trials 1-3 14/27 (44) 11/27 (41) 18 Below Average    Delayed Recall 5/9 (44) 4/9 (43) 25 Average    Retention Percentage 83 80 (44) 27 Average    Recognition Discriminability 6 7 (52) 58 Average  Story Learning        Immediate Recall 19/80 (21) 44/80 (39) 14 Below Average    Delayed Recall 12/40 (31) 26/40 (44) 27 Average    Retention Percentage 86 93 (50) 50 Average  Daily Living Memory        Immediate Recall 38/51 (42) 40/51 (50) 50 Average    Delayed Recall 10/17 (34) 9/17 (37) 9 Below Average    Retention Percentage 37 53 (22) <1 Exceptionally Low    Recognition Hits 8/10 9/10 (51) 54 Average        Attention/Executive Function:      Trail Making Test (TMT): T Score Raw Score (T Score) Percentile     Part A 65 28 secs.,  0 errors (56) 73 Average    Part B 68 55 secs.,  0 errors (34) 92 Well Above Average          Scaled Score Scaled Score Percentile   WAIS-IV Coding: 11 14 91 Well Above Average        NAB Attention Module, Form 1: T Score T Score Percentile     Digits Forward --- 48 42 Average    Digits Backwards --- 31 3 Well Below Average        D-KEFS Color-Word Interference Test: Raw Score (Scaled Score) Raw Score (Scaled Score) Percentile     Color Naming 26 secs. (12) 26 secs. (13) 84 Above Average    Word Reading 22 secs. (11) 19 secs. (13) 84 Above Average    Inhibition 47 secs. (13) 59 secs. (12) 75 Above Average      Total Errors 0 errors (12) 2 errors (10) 50 Average    Inhibition/Switching 52 secs. (13) 49 secs. (14) 58 Well Above Average      Total Errors 1 error (11)  2 errors (11) 63 Average        D-KEFS Verbal Fluency Test: Raw Score (Scaled Score) Raw Score (Scaled Score) Percentile     Letter Total Correct --- 38 (11) 63 Average    Category Total Correct --- 45 (14) 91 Well Above Average    Category Switching Total Correct --- 12 (9) 37 Average    Category Switching Accuracy --- 11 (10) 50 Average      Total Set Loss Errors --- 1 (11) 63 Average      Total Repetition Errors --- 2 (11) 63 Average        [place] Card Sorting Test: Raw Score Raw Score Percentile     Categories (trials) --- 4 (64) >16 Within Normal Limits    Total Errors --- 15 58 Average    Perseverative Errors ---  9 50 Average    Non-Perseverative Errors --- 6 38 Average    Failure to Maintain Set --- 0 --- ---        Language:      Verbal Fluency Test: T Score Raw Score (T Score) Percentile     Phonemic Fluency (FAS) 38 38 (51) 54 Average    Animal Fluency 32 25 (65) 93 Well Above Average         NAB Language Module, Form 1: T Score T Score Percentile     Auditory Comprehension --- 58 79 Above Average    Naming 31/31 (54) 31/31 (56) 73 Average        Visuospatial/Visuoconstruction:       Raw Score Raw Score Percentile   Clock Drawing: 10/10 10/10 --- Within Normal Limits        NAB Spatial Module, Form 1: T Score T Score Percentile     Figure Drawing Copy --- 41 18 Below Average         Scaled Score Scaled Score Percentile   WAIS-IV Block Design: --- 8 25 Average        Mood and Personality:       Raw Score Raw Score Percentile   Beck Depression Inventory - II: --- 3 --- Within Normal Limits  PROMIS Anxiety Questionnaire: --- 14 --- None to Slight        Additional Questionnaires:       Raw Score Raw Score Percentile   PROMIS Sleep Disturbance Questionnaire: --- 8 --- None to Slight   Informed Consent and Coding/Compliance:   The current evaluation represents a clinical evaluation for the purposes previously outlined by the referral source and is in no way  reflective of a forensic evaluation.   Mr. Spratling was provided with a verbal description of the nature and purpose of the present neuropsychological evaluation. Also reviewed were the foreseeable risks and/or discomforts and benefits of the procedure, limits of confidentiality, and mandatory reporting requirements of this provider. The patient was given the opportunity to ask questions and receive answers about the evaluation. Oral consent to participate was provided by the patient.   This evaluation was conducted by Christia Reading, Ph.D., ABPP-CN, board certified clinical neuropsychologist. Mr. Buchta completed a clinical interview with Dr. Melvyn Novas, billed as one unit [phone], and 125 minutes of cognitive testing and scoring, billed as one unit [phone] and three additional units 96139. Psychometrist Milana Kidney, B.S., assisted Dr. Melvyn Novas with test administration and scoring procedures. As a separate and discrete service, Dr. Melvyn Novas spent a total of 160 minutes in interpretation and report writing billed as one unit [phone] and two units [id number].

## 2022-04-19 NOTE — Progress Notes (Signed)
   Psychometrician Note   Cognitive testing was administered to Leanora Cover by Milana Kidney, B.S. (psychometrist) under the supervision of Dr. Christia Reading, Ph.D., licensed psychologist on 04/19/2022. Mr. Grotz did not appear overtly distressed by the testing session per behavioral observation or responses across self-report questionnaires. Rest breaks were offered.    The battery of tests administered was selected by Dr. Christia Reading, Ph.D. with consideration to Mr. Mussell's current level of functioning, the nature of his symptoms, emotional and behavioral responses during interview, level of literacy, observed level of motivation/effort, and the nature of the referral question. This battery was communicated to the psychometrist. Communication between Dr. Christia Reading, Ph.D. and the psychometrist was ongoing throughout the evaluation and Dr. Christia Reading, Ph.D. was immediately accessible at all times. Dr. Christia Reading, Ph.D. provided supervision to the psychometrist on the date of this service to the extent necessary to assure the quality of all services provided.    Leanora Cover will return within approximately 1-2 weeks for an interactive feedback session with Dr. Melvyn Novas at which time his test performances, clinical impressions, and treatment recommendations will be reviewed in detail. Mr. Moline understands he can contact our office should he require our assistance before this time.  A total of 125 minutes of billable time were spent face-to-face with Mr. Clearman by the psychometrist. This includes both test administration and scoring time. Billing for these services is reflected in the clinical report generated by Dr. Christia Reading, Ph.D.  This note reflects time spent with the psychometrician and does not include test scores or any clinical interpretations made by Dr. Melvyn Novas. The full report will follow in a separate note.

## 2022-04-22 ENCOUNTER — Encounter: Payer: Self-pay | Admitting: Psychology

## 2022-04-25 ENCOUNTER — Ambulatory Visit (INDEPENDENT_AMBULATORY_CARE_PROVIDER_SITE_OTHER): Payer: No Typology Code available for payment source | Admitting: Psychology

## 2022-04-25 DIAGNOSIS — E538 Deficiency of other specified B group vitamins: Secondary | ICD-10-CM | POA: Diagnosis not present

## 2022-04-25 DIAGNOSIS — G3184 Mild cognitive impairment, so stated: Secondary | ICD-10-CM

## 2022-04-25 DIAGNOSIS — R41840 Attention and concentration deficit: Secondary | ICD-10-CM | POA: Diagnosis not present

## 2022-04-25 NOTE — Progress Notes (Signed)
   Neuropsychology Feedback Session Paul Bird. [facility]  Reason for Referral:   Paul Bird is a 62 y.o. ambidextrous (writes with left hand) Caucasian male referred by  Carolann Littler, M.D. , to characterize his current cognitive functioning and assist with diagnostic clarity and treatment planning in the context of subjective cognitive decline and prior concerns surrounding an amnestic MCI presentation.   Feedback:   [name] completed a comprehensive neuropsychological evaluation on 04/19/2022. Please refer to that encounter for the full report and recommendations. Briefly, results suggested an isolated impairment surrounding complex attention and concentration. A further weakness was exhibited across delayed retrieval aspects of both verbal and visual memory; however, there was some variability amongst these scores rather than consistent deficits. Relative to his previous evaluation in September 2021, a notable improvement was exhibited across semantic fluency. Specific to memory, improvements were exhibited across a story learning task in general, as well as a list learning recognition trial. Other aspects of memory exhibited stability, along with the majority of other performances across the total evaluation. Only complex attention exhibited a decline relative to previous testing. The etiology for ongoing memory dysfunction continues to be unclear. Relative to prior concerns expressed surrounding Alzheimer's disease, I am encouraged by the current results. Despite an overall impairment retrieving information from memory storage upon demand, [name] was able to demonstrate intact memory storage based upon consistently appropriate memory recognition performances. His overall improvement across a story-based task, as well as semantic fluency, currently bodes well as this does not align with the typical trajectory of this illness. Overall, while it  is important to highlight that this illness still remains a possibility and I cannot rule it out, current testing also does not allow me to rule it in with confidence. Medically, his ADHD is untreated. 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 There remains the potential that current objective and subjective deficits are related to the combination of these factors.   [name] was accompanied by his wife during the current feedback session. Content of the current session focused on the results of his neuropsychological evaluation. [name] was given the opportunity to ask questions and his questions were answered. He was encouraged to reach out should additional questions arise. A copy of his report was provided at the conclusion of the visit.      20 minutes were spent conducting the current feedback session with [name], billed as one unit [phone].

## 2022-04-26 ENCOUNTER — Telehealth: Payer: Self-pay

## 2022-04-26 NOTE — Telephone Encounter (Signed)
Appointment scheduled.

## 2022-04-26 NOTE — Telephone Encounter (Signed)
-----  Message from [name], MD sent at 04/25/2022  5:11 PM EST ----- Set this patient up for follow up to discuss possible initiation of ADD medication.   Set up 30 minute appointment.   ----- Message ----- From: Hazle Coca, PhD Sent: 04/25/2022   3:21 PM EST To: [name], MD  Hey Dr. Elease Hashimoto. Paul Bird wanted me to reach out to make sure you saw this report. He also indicated that would be more willing to trialing a stimulant or other medication aimed to treat his ADHD more directly.

## 2022-04-30 ENCOUNTER — Ambulatory Visit: Payer: No Typology Code available for payment source | Admitting: Family Medicine

## 2022-04-30 ENCOUNTER — Encounter: Payer: Self-pay | Admitting: Family Medicine

## 2022-04-30 VITALS — BP 140/78 | HR 65 | Temp 98.3°F | Ht 73.0 in | Wt 195.9 lb

## 2022-04-30 DIAGNOSIS — E538 Deficiency of other specified B group vitamins: Secondary | ICD-10-CM | POA: Diagnosis not present

## 2022-04-30 DIAGNOSIS — R413 Other amnesia: Secondary | ICD-10-CM

## 2022-04-30 DIAGNOSIS — F909 Attention-deficit hyperactivity disorder, unspecified type: Secondary | ICD-10-CM | POA: Diagnosis not present

## 2022-04-30 MED ORDER — LISDEXAMFETAMINE DIMESYLATE 30 MG PO CAPS: 30.0000 mg | ORAL_CAPSULE | Freq: Every day | ORAL | 0 refills | Status: DC

## 2022-04-30 MED ORDER — TADALAFIL 20 MG PO TABS: ORAL_TABLET | ORAL | 5 refills | Status: DC

## 2022-04-30 NOTE — Progress Notes (Signed)
Established Patient Office Visit  Subjective   Patient ID: Paul Bird, male    DOB: 18-Oct-1960  Age: 62 y.o. MRN: [id number]  Chief Complaint  Patient presents with   Medication Consultation    HPI   [name] is seen accompanied by his wife to discuss possible initiation of ADD medications.  He had recent comprehensive neuropsychological assessment and fairly definitive evidence for element of ADD.  He had brought up concerns about memory issues a couple years ago and that seems to be relatively stable.  He was diagnosed with low B12 and apparently even with weekly B12 injections and has difficulty getting his levels up.  No recent thyroid assessment.  Does have some general fatigue issues although he exercises with [employer] exercise group 5 to 6 days/week.  Still feels his memory is not sharp at times.  Is requesting magnesium level and repeat B12.  Has never taken medications for ADD in the past.  His wife thinks some of his memory issue may be due to processing issues with poor attention.  He would definitely like to consider trial of medication.  His job requires multitasking which makes work difficult frequently.  Past Medical History:  Diagnosis Date   Actinic keratosis 04/19/2009   Adenomatous polyp    ADHD (attention deficit hyperactivity disorder) 10/12/2013   Dysesthesia 09/08/2009   Erectile dysfunction 04/19/2009   Qualifier: Diagnosis of   By: Joyce Gross       Melanoma of skin 11/19/2017   Clark's stage I, depth 0.2 mm   Mild cognitive impairment of uncertain or unknown etiology 04/19/2022   Vitamin B12 deficiency    Past Surgical History:  Procedure Laterality Date   COLONOSCOPY     melanoma removal  2019   neck and chest    [place]  2001   right    reports that he has never smoked. He has never used smokeless tobacco. He reports current alcohol use. He reports that he does not use drugs. family history includes  Dementia in his mother; Heart disease (age of onset: 5) in his father; Memory loss in his mother. No Known Allergies  Review of Systems  Eyes:  Negative for blurred vision.  Respiratory:  Negative for shortness of breath.   Cardiovascular:  Negative for chest pain.  Gastrointestinal:  Negative for abdominal pain.  Neurological:  Negative for dizziness, weakness and headaches.  Psychiatric/Behavioral:  Positive for memory loss.       Objective:     BP (!) 140/78 (BP Location: Left Arm, Patient Position: Sitting, Cuff Size: Normal)   Pulse 65   Temp 98.3 F (36.8 C) (Oral)   Ht '6\' 1"'$  (1.854 m)   Wt 195 lb 14.4 oz (88.9 kg)   SpO2 98%   BMI 25.85 kg/m    Physical Exam Vitals reviewed.  Constitutional:      Appearance: He is well-developed.  HENT:     Right Ear: External ear normal.     Left Ear: External ear normal.  Eyes:     Pupils: Pupils are equal, round, and reactive to light.  Neck:     Thyroid: No thyromegaly.  Cardiovascular:     Rate and Rhythm: Normal rate and regular rhythm.  Pulmonary:     Effort: Pulmonary effort is normal. No respiratory distress.     Breath sounds: Normal breath sounds. No wheezing or rales.  Musculoskeletal:  Cervical back: Neck supple.  Neurological:     Mental Status: He is alert and oriented to person, place, and time.  Psychiatric:        Mood and Affect: Mood normal.      No results found for any visits on 04/30/22.    The 10-year ASCVD risk score (Arnett DK, et al., 2019) is: 8.6%    Assessment & Plan:   #1 adult ADD.  He had recent comprehensive neuropsychological assessment with confirmation of ADD issues.  I had a long talk with patient and his spouse regarding options.  They would like to try ADD medication.  We reviewed various options.  They have interest in once daily slow release.  We discussed multiple potential side effects including dry mouth, transient insomnia, appetite suppression.  We ended up  deciding to try Vyvanse 30 mg once daily.  Give feedback in 1 month regarding response.  #2 memory concerns and general fatigue.  He does have history of low testosterone and apparently is seeing someone with a male clinic that administers testosterone and just got first injection yesterday.  He is requesting follow-up labs of B12.  Will check B12, TSH, magnesium level.  He is giving home B12 injections with history of low B12 in the past   No follow-ups on file.    Carolann Littler, MD

## 2022-05-01 LAB — MAGNESIUM: Magnesium: 2 mg/dL (ref 1.5–2.5)

## 2022-05-01 LAB — VITAMIN B12: Vitamin B-12: 830 pg/mL (ref 211–911)

## 2022-05-01 LAB — TSH: TSH: 0.86 u[IU]/mL (ref 0.35–5.50)

## 2023-01-26 ENCOUNTER — Other Ambulatory Visit: Payer: Self-pay

## 2023-01-26 ENCOUNTER — Encounter (HOSPITAL_BASED_OUTPATIENT_CLINIC_OR_DEPARTMENT_OTHER): Payer: Self-pay | Admitting: Emergency Medicine

## 2023-01-26 ENCOUNTER — Observation Stay (HOSPITAL_COMMUNITY): Payer: No Typology Code available for payment source

## 2023-01-26 ENCOUNTER — Observation Stay (HOSPITAL_BASED_OUTPATIENT_CLINIC_OR_DEPARTMENT_OTHER)
Admission: EM | Admit: 2023-01-26 | Discharge: 2023-01-28 | Disposition: A | Discharge status: Home / Self Care | Payer: No Typology Code available for payment source | Attending: Internal Medicine | Admitting: Internal Medicine

## 2023-01-26 ENCOUNTER — Emergency Department (HOSPITAL_BASED_OUTPATIENT_CLINIC_OR_DEPARTMENT_OTHER): Payer: No Typology Code available for payment source

## 2023-01-26 DIAGNOSIS — R2990 Unspecified symptoms and signs involving the nervous system: Secondary | ICD-10-CM | POA: Diagnosis not present

## 2023-01-26 DIAGNOSIS — G459 Transient cerebral ischemic attack, unspecified: Secondary | ICD-10-CM

## 2023-01-26 DIAGNOSIS — R001 Bradycardia, unspecified: Secondary | ICD-10-CM | POA: Insufficient documentation

## 2023-01-26 DIAGNOSIS — I455 Other specified heart block: Secondary | ICD-10-CM | POA: Insufficient documentation

## 2023-01-26 DIAGNOSIS — R200 Anesthesia of skin: Secondary | ICD-10-CM | POA: Diagnosis not present

## 2023-01-26 DIAGNOSIS — Z85828 Personal history of other malignant neoplasm of skin: Secondary | ICD-10-CM | POA: Insufficient documentation

## 2023-01-26 DIAGNOSIS — I495 Sick sinus syndrome: Secondary | ICD-10-CM | POA: Insufficient documentation

## 2023-01-26 DIAGNOSIS — R55 Syncope and collapse: Principal | ICD-10-CM

## 2023-01-26 DIAGNOSIS — R531 Weakness: Secondary | ICD-10-CM

## 2023-01-26 DIAGNOSIS — Z79899 Other long term (current) drug therapy: Secondary | ICD-10-CM | POA: Insufficient documentation

## 2023-01-26 LAB — DIFFERENTIAL
Abs Immature Granulocytes: 0.02 10*3/uL (ref 0.00–0.07)
Basophils Absolute: 0 10*3/uL (ref 0.0–0.1)
Basophils Relative: 1 %
Eosinophils Absolute: 0.4 10*3/uL (ref 0.0–0.5)
Eosinophils Relative: 5 %
Immature Granulocytes: 0 %
Lymphocytes Relative: 23 %
Lymphs Abs: 1.7 10*3/uL (ref 0.7–4.0)
Monocytes Absolute: 0.6 10*3/uL (ref 0.1–1.0)
Monocytes Relative: 8 %
Neutro Abs: 4.5 10*3/uL (ref 1.7–7.7)
Neutrophils Relative %: 63 %

## 2023-01-26 LAB — PROTIME-INR
INR: 0.9 (ref 0.8–1.2)
Prothrombin Time: 12.4 s (ref 11.4–15.2)

## 2023-01-26 LAB — URINALYSIS, ROUTINE W REFLEX MICROSCOPIC
Bacteria, UA: NONE SEEN
Bilirubin Urine: NEGATIVE
Glucose, UA: NEGATIVE mg/dL
Hgb urine dipstick: NEGATIVE
Ketones, ur: NEGATIVE mg/dL
Leukocytes,Ua: NEGATIVE
Nitrite: NEGATIVE
Protein, ur: 30 mg/dL — AB
Specific Gravity, Urine: 1.03 (ref 1.005–1.030)
pH: 6.5 (ref 5.0–8.0)

## 2023-01-26 LAB — COMPREHENSIVE METABOLIC PANEL
ALT: 31 U/L (ref 0–44)
AST: 52 U/L — ABNORMAL HIGH (ref 15–41)
Albumin: 4.4 g/dL (ref 3.5–5.0)
Alkaline Phosphatase: 54 U/L (ref 38–126)
Anion gap: 4 — ABNORMAL LOW (ref 5–15)
BUN: 18 mg/dL (ref 8–23)
CO2: 31 mmol/L (ref 22–32)
Calcium: 9.7 mg/dL (ref 8.9–10.3)
Chloride: 103 mmol/L (ref 98–111)
Creatinine, Ser: 1.06 mg/dL (ref 0.61–1.24)
GFR, Estimated: >60 mL/min (ref >60)
Glucose, Bld: 125 mg/dL — ABNORMAL HIGH (ref 70–99)
Potassium: 4.6 mmol/L (ref 3.5–5.1)
Sodium: 138 mmol/L (ref 135–145)
Total Bilirubin: 0.5 mg/dL (ref 0.3–1.2)
Total Protein: 7.8 g/dL (ref 6.5–8.1)

## 2023-01-26 LAB — RAPID URINE DRUG SCREEN, HOSP PERFORMED
Amphetamines: NOT DETECTED
Barbiturates: NOT DETECTED
Benzodiazepines: NOT DETECTED
Cocaine: NOT DETECTED
Opiates: NOT DETECTED
Tetrahydrocannabinol: POSITIVE — AB

## 2023-01-26 LAB — CBC
HCT: 46.9 % (ref 39.0–52.0)
Hemoglobin: 15 g/dL (ref 13.0–17.0)
MCH: 25.9 pg — ABNORMAL LOW (ref 26.0–34.0)
MCHC: 32 g/dL (ref 30.0–36.0)
MCV: 80.9 fL (ref 80.0–100.0)
Platelets: 211 10*3/uL (ref 150–400)
RBC: 5.8 MIL/uL (ref 4.22–5.81)
RDW: 15 % (ref 11.5–15.5)
WBC: 7.2 10*3/uL (ref 4.0–10.5)
nRBC: 0 % (ref 0.0–0.2)

## 2023-01-26 LAB — APTT: aPTT: 25 s (ref 24–36)

## 2023-01-26 LAB — CBG MONITORING, ED: Glucose-Capillary: 121 mg/dL — ABNORMAL HIGH (ref 70–99)

## 2023-01-26 LAB — ETHANOL: Alcohol, Ethyl (B): 12 mg/dL — ABNORMAL HIGH (ref <10)

## 2023-01-26 LAB — MAGNESIUM: Magnesium: 2.2 mg/dL (ref 1.7–2.4)

## 2023-01-26 MED ORDER — ACETAMINOPHEN 325 MG PO TABS: 650.0000 mg | ORAL_TABLET | Freq: Four times a day (QID) | ORAL | Status: DC | PRN

## 2023-01-26 MED ORDER — ENOXAPARIN SODIUM 40 MG/0.4ML IJ SOSY
40.0000 mg | PREFILLED_SYRINGE | INTRAMUSCULAR | Status: DC
Start: 2023-01-26 — End: 2023-01-28
  Administered 2023-01-26 – 2023-01-27 (×2): 40 mg via SUBCUTANEOUS
  Filled 2023-01-26 (×2): qty 0.4

## 2023-01-26 MED ORDER — IOHEXOL 350 MG/ML SOLN
75.0000 mL | Freq: Once | INTRAVENOUS | Status: AC | PRN
  Administered 2023-01-26: 75 mL via INTRAVENOUS

## 2023-01-26 MED ORDER — ACETAMINOPHEN 650 MG RE SUPP: 650.0000 mg | Freq: Four times a day (QID) | RECTAL | Status: DC | PRN

## 2023-01-26 MED ORDER — STROKE: EARLY STAGES OF RECOVERY BOOK
Freq: Once | Status: AC
Start: 2023-01-27 — End: 2023-01-27
  Filled 2023-01-26: qty 1

## 2023-01-26 MED ORDER — SODIUM CHLORIDE 0.9% FLUSH
3.0000 mL | Freq: Two times a day (BID) | INTRAVENOUS | Status: DC
  Administered 2023-01-26 – 2023-01-27 (×3): 3 mL via INTRAVENOUS

## 2023-01-26 MED ORDER — POLYETHYLENE GLYCOL 3350 17 G PO PACK
17.0000 g | PACK | Freq: Every day | ORAL | Status: DC | PRN
Start: 2023-01-26 — End: 2023-01-28

## 2023-01-26 NOTE — ED Notes (Signed)
1036 Code stroke cart activated- pt in CT. LKW 1015. Pt was at breakfast table with wife when wife states both his eyes rolled back in his head, he became confused/altered, stopped talking for a few minutes. Pt came to and said he felt "brain fog" and his R side became tingly. Pt states he has blurry vision in R upper quadrant of visual field. BP 149/85 HR 65, glucose 121. Takes no blood thinners 1038 Dr Selina Cooley paged 1042 pt back from CT 1049 Dr Selina Cooley on camera- told of negative NCCT read

## 2023-01-26 NOTE — H&P (Signed)
History and Physical   Paul Bird:[id number] DOB: 11-12-1960 DOA: 01/26/2023  PCP: Kristian Covey, MD   Patient coming from: Home  Chief Complaint: Syncope/strokelike symptoms  HPI: Paul Bird is a 62 y.o. male with medical history significant of mild cognitive impairment, ADHD presenting after syncopal event with subsequent neurologic deficits.  Patient was in normal state of health until earlier today when patient suddenly felt unwell and was noted to become diaphoretic followed by his eyes "rolling back "he then syncopized and was unconscious for 2 to 3 minutes.  He largely returned back to normal but then noted afterwards he had right sided tingling and trouble seeing in his right visual fields.  Patient was transported to the ED and a code stroke was called in triage.  Does report taking a THC gummy and drinking some alcohol today.  Denies fevers, chills, chest pain, shortness of breath, abdominal pain, constipation, diarrhea, nausea, vomiting.  ED Course: Vital signs in the ED notable for blood pressure 140 systolic.  Heart rate largely in the 50s to 70s however did have an episode where heart rate dropped to the 30s and had a 6-second pause.  Lab workup included CMP with glucose 125, AST 52.  CBC within normal limits.  PT, PTT, INR within normal limits.  Ethanol level 12.  UDS positive for THC.  Urinalysis showing protein only.  TSH pending.  CT head showed no acute abnormality.  Neurology was consulted and felt patient will's not appropriate for TNK but recommended observation and medical admission**.  Cardiology consulted due to pulse and bradycardia and will see the patient on arrival.  Review of Systems: As per HPI otherwise all other systems reviewed and are negative.  Past Medical History:  Diagnosis Date   Actinic keratosis 04/19/2009   Adenomatous polyp    ADHD (attention deficit hyperactivity disorder) 10/12/2013   Dysesthesia 09/08/2009   Erectile  dysfunction 04/19/2009   Qualifier: Diagnosis of   By: Rita Ohara       Melanoma of skin 11/19/2017   Clark's stage I, depth 0.2 mm   Mild cognitive impairment of uncertain or unknown etiology 04/19/2022   Vitamin B12 deficiency     Past Surgical History:  Procedure Laterality Date   COLONOSCOPY     melanoma removal  2019   neck and chest    NECK SURGERY     POLYPECTOMY     ROTATOR CUFF REPAIR  2001   right    Social History  reports that he has never smoked. He has never used smokeless tobacco. He reports current alcohol use. He reports that he does not use drugs.  No Known Allergies  Family History  Problem Relation Age of Onset   Dementia Mother        39s   Memory loss Mother    Heart disease Father 58       ?viral myocarditis   Colon cancer Neg Hx    Colon polyps Neg Hx    Rectal cancer Neg Hx    Ulcerative colitis Neg Hx    Stomach cancer Neg Hx    Esophageal cancer Neg Hx   Reviewed on admission  Prior to Admission medications   Medication Sig Start Date End Date Taking? Authorizing Provider  cyanocobalamin (VITAMIN B12) 1000 MCG/ML injection Inject 1,000 mcg into the muscle every Friday.   Yes [provider]  tadalafil (CIALIS) 20 MG tablet Take one tablet by mouth every other day as needed for  erectile dysfunction 04/30/22  Yes Kristian Covey, MD    Physical Exam: Vitals:   01/26/23 1045 01/26/23 1052 01/26/23 1100 01/26/23 1531  BP: (!) 149/85   (!) 141/90  Pulse: 69   75  Resp: (!) 22   17  Temp:  98 F (36.7 C)  97.6 F (36.4 C)  TempSrc:  Oral  Oral  SpO2: 95%   98%  Weight:   89.4 kg     Physical Exam Constitutional:      General: He is not in acute distress.    Appearance: Normal appearance.  HENT:     Head: Normocephalic and atraumatic.     Mouth/Throat:     Mouth: Mucous membranes are moist.     Pharynx: Oropharynx is clear.  Eyes:     Extraocular Movements: Extraocular movements intact.     Pupils: Pupils are  equal, round, and reactive to light.  Cardiovascular:     Rate and Rhythm: Normal rate and regular rhythm.     Pulses: Normal pulses.     Heart sounds: Normal heart sounds.  Pulmonary:     Effort: Pulmonary effort is normal. No respiratory distress.     Breath sounds: Normal breath sounds.  Abdominal:     General: Bowel sounds are normal. There is no distension.     Palpations: Abdomen is soft.     Tenderness: There is no abdominal tenderness.  Musculoskeletal:        General: No swelling or deformity.  Skin:    General: Skin is warm and dry.  Neurological:     Comments: Mental Status: Patient is awake, alert, oriented x3 No signs of aphasia or neglect Cranial Nerves: II: Pupils equal, round, and reactive to light. Decrease acuity in right visual fields. III,IV, VI: EOMI without ptosis or diploplia.  V: Facial sensation is symmetric to light touch and  Temperature. VII: Facial movement is symmetric.  VIII: hearing is intact to voice X: Uvula elevates symmetrically XI: Shoulder shrug is symmetric. XII: tongue is midline without atrophy or fasciculations.  Motor: Good effort thorughout, at Least 5/5 bilateral UE, 5/5 bilateral lower extremitiy  Sensory: Sensation is grossly intact bilateral UEs & LEs Cerebellar: Finger-Nose intact bilalat    Labs on Admission: I have personally reviewed following labs and imaging studies  CBC: Recent Labs  Lab 01/26/23 1035  WBC 7.2  NEUTROABS 4.5  HGB 15.0  HCT 46.9  MCV 80.9  PLT 211    Basic Metabolic Panel: Recent Labs  Lab 01/26/23 1035  NA 138  K 4.6  CL 103  CO2 31  GLUCOSE 125*  BUN 18  CREATININE 1.06  CALCIUM 9.7    GFR: CrCl cannot be calculated (Unknown ideal weight.).  Liver Function Tests: Recent Labs  Lab 01/26/23 1035  AST 52*  ALT 31  ALKPHOS 54  BILITOT 0.5  PROT 7.8  ALBUMIN 4.4    Urine analysis:    Component Value Date/Time   COLORURINE YELLOW 01/26/2023 1113   APPEARANCEUR CLEAR  01/26/2023 1113   LABSPEC 1.030 01/26/2023 1113   PHURINE 6.5 01/26/2023 1113   GLUCOSEU NEGATIVE 01/26/2023 1113   HGBUR NEGATIVE 01/26/2023 1113   BILIRUBINUR NEGATIVE 01/26/2023 1113   BILIRUBINUR neg 10/04/2013 1142   KETONESUR NEGATIVE 01/26/2023 1113   PROTEINUR 30 (A) 01/26/2023 1113   UROBILINOGEN 1.0 10/04/2013 1142   NITRITE NEGATIVE 01/26/2023 1113   LEUKOCYTESUR NEGATIVE 01/26/2023 1113    Radiological Exams on Admission: CT HEAD CODE STROKE  WO CONTRAST  Addendum Date: 01/26/2023   ADDENDUM REPORT: 01/26/2023 11:03 ADDENDUM: Study discussed by telephone with Dr. Jesusita Oka FLOYD on 01/26/2023 at 1049 hours. Electronically Signed   By: Odessa Fleming M.D.   On: 01/26/2023 11:03   Result Date: 01/26/2023 CLINICAL DATA:  Code stroke. 62 year old male right upper extremity deficit. EXAM: CT HEAD WITHOUT CONTRAST TECHNIQUE: Contiguous axial images were obtained from the base of the skull through the vertex without intravenous contrast. RADIATION DOSE REDUCTION: This exam was performed according to the departmental dose-optimization program which includes automated exposure control, adjustment of the mA and/or kV according to patient size and/or use of iterative reconstruction technique. COMPARISON:  Brain MRI 01/27/2020. FINDINGS: Brain: Cerebral volume is within normal limits for age. No midline shift, ventriculomegaly, mass effect, evidence of mass lesion, intracranial hemorrhage or evidence of cortically based acute infarction. Gray-white matter differentiation is within normal limits throughout the brain. Vascular: Calcified atherosclerosis at the skull base. No suspicious intracranial vascular hyperdensity. Skull: No acute osseous abnormality identified. Sinuses/Orbits: Minor maxillary sinus mucosal thickening on the right otherwise well aerated. Other: No gaze deviation. No acute orbit or scalp soft tissue finding. ASPECTS [facility] Stroke Program Early CT Score) Total score (0-10 with 10 being  normal): 10 IMPRESSION: Normal for age noncontrast CT appearance of the brain.  ASPECTS 10. Electronically Signed: By: Odessa Fleming M.D. On: 01/26/2023 10:45    EKG: Independently reviewed.  Sinus rhythm at 60 bpm.  J-point elevation versus repolarization abnormality in V5 and ?V2.  Assessment/Plan Principal Problem:   Syncope and collapse Active Problems:   Stroke-like symptoms   Sinus pause   Bradycardia   Syncope and collapse > Patient noted to have episode of syncope preceded by feeling unwell, diaphoresis. > Was witnessed and reportedly his eyes rolled back and then syncopized with 2 to 3 minutes of loss of consciousness. > Afterwards had some focal deficits as below. > Noted to have sinus pauses and some bradycardia during an episode in the ED etiology syncope appears to be most likely secondary to this.  Suspicion for vasovagal etiology, Details below. - Monitor on telemetry overnight - Orthotic vital signs - Echocardiogram - Supportive care  Strokelike symptoms > Following syncopal episode patient had right-sided tingling and trouble seeing in his right visual fields.  Code stroke called in the ED. > Teleneurology consulted and reportedly he felt patient would not benefit from thrombolysis based on his minimal deficits.  Recommended observation and admission to medicine. > Symptoms improving but sensation still mildly abnormal in RUE and decrease acuity in right visual fields. - Discussed with teleneuro who will notify neurology team here - Allow for permissive HTN (systolic < 220 and diastolic < 120)  - Statin, pending lipid panel results  - Echocardiogram  - CTA head & neck  - A1C  - Lipid panel  - Tele monitoring  - SLP eval - PT/OT  Sinus pause Bradycardia > Patient had episode of 6-second pause with some nonconducted P waves and transient bradycardia in the ED. > Suspect syncopal episode above was secondary to similar event. > Cardiology has been consulted.  Suspect  etiology is vagally mediated. - Monitoring on progressive - Appreciate cardiology recommendations, plan is for EP to evaluate tomorrow as well - Check TSH - Check magnesium - Supportive care  DVT prophylaxis: Lovenox  Code Status:   Full Family Communication:  Updated at bedside  Disposition Plan:   Patient is from:  Home  Anticipated DC to:  Home  Anticipated DC date:  1 to 2 days  Anticipated DC barriers: None  Consults called:  Neurology, cardiology Admission status:  Observation, progressive  Severity of Illness: The appropriate patient status for this patient is OBSERVATION. Observation status is judged to be reasonable and necessary in order to provide the required intensity of service to ensure the patient's safety. The patient's presenting symptoms, physical exam findings, and initial radiographic and laboratory data in the context of their medical condition is felt to place them at decreased risk for further clinical deterioration. Furthermore, it is anticipated that the patient will be medically stable for discharge from the hospital within 2 midnights of admission.    Synetta Fail MD [facility]  How to contact the [facility] Attending or Consulting provider 7A - 7P or covering provider during after hours 7P -7A, for this patient?   Check the care team in [facility] and look for a) attending/consulting TR[facility] provider listed and b) the [facility] team listed Log into [email or web address] and use 's universal password to access. If you do not have the password, please contact the hospital operator. Locate the [facility] provider you are looking for under [facility] and page to a number that you can be directly reached. If you still have difficulty reaching the provider, please page the [facility] (Director on Call) for the Hospitalists listed on amion for assistance.  01/26/2023, 4:03 PM

## 2023-01-26 NOTE — ED Provider Notes (Signed)
[place] EMERGENCY DEPARTMENT AT [facility] Provider Note   CSN: [id number] Arrival date & time: 01/26/23  1029  An emergency department physician performed an initial assessment on this suspected stroke patient at 1042.  History  Chief Complaint  Patient presents with   Code Stroke    Paul Bird is a 62 y.o. male.  62 yo M with a chief complaints of sudden onset not feeling well.  Was then got very sweaty and his eyes rolled back in his head and he was unresponsive for a few minutes.  He is now back to normal per the family but was reporting some right-sided tingling and felt like he had trouble seeing in the right visual fields and she was made a code stroke in triage.  Level 5 caveat acuity of condition.        Home Medications Prior to Admission medications   Medication Sig Start Date End Date Taking? Authorizing Provider  lisdexamfetamine (VYVANSE) 30 MG capsule Take 1 capsule (30 mg total) by mouth daily. 04/30/22   Burchette, Elberta Fortis, MD  mupirocin ointment (BACTROBAN) 2 % Apply topically daily. Patient not taking: Reported on 04/30/2022 10/22/21   [provider]  prednisoLONE acetate (PRED FORTE) 1 % ophthalmic suspension Place 1 drop into the left eye 4 (four) times daily. Patient not taking: Reported on 04/30/2022 12/19/21   [provider]  tadalafil (CIALIS) 20 MG tablet Take one tablet by mouth every other day as needed for erectile dysfunction 04/30/22   Burchette, Elberta Fortis, MD  XDEMVY 0.25 % SOLN Apply to eye. Patient not taking: Reported on 04/30/2022 04/12/22   [provider]      Allergies    Patient has no known allergies.    Review of Systems   Review of Systems  Physical Exam Updated Vital Signs BP (!) 149/85   Pulse 69   Temp 98 F (36.7 C) (Oral)   Resp (!) 22   Wt 89.4 kg   SpO2 95%   BMI 25.99 kg/m  Physical Exam Vitals and nursing note reviewed.  Constitutional:      Appearance: He is  well-developed.  HENT:     Head: Normocephalic and atraumatic.  Eyes:     Pupils: Pupils are equal, round, and reactive to light.  Neck:     Vascular: No JVD.  Cardiovascular:     Rate and Rhythm: Normal rate and regular rhythm.     Heart sounds: No murmur heard.    No friction rub. No gallop.  Pulmonary:     Effort: No respiratory distress.     Breath sounds: No wheezing.  Abdominal:     General: There is no distension.     Tenderness: There is no abdominal tenderness. There is no guarding or rebound.  Musculoskeletal:        General: Normal range of motion.     Cervical back: Normal range of motion and neck supple.  Skin:    Coloration: Skin is not pale.     Findings: No rash.  Neurological:     Mental Status: He is alert and oriented to person, place, and time.  Psychiatric:        Behavior: Behavior normal.     ED Results / Procedures / Treatments   Labs (all labs ordered are listed, but only abnormal results are displayed) Labs Reviewed  ETHANOL - Abnormal; Notable for the following components:      Result Value   Alcohol, Ethyl (  B) 12 (*)    All other components within normal limits  CBC - Abnormal; Notable for the following components:   MCH 25.9 (*)    All other components within normal limits  COMPREHENSIVE METABOLIC PANEL - Abnormal; Notable for the following components:   Glucose, Bld 125 (*)    AST 52 (*)    Anion gap 4 (*)    All other components within normal limits  RAPID URINE DRUG SCREEN, HOSP PERFORMED - Abnormal; Notable for the following components:   Tetrahydrocannabinol POSITIVE (*)    All other components within normal limits  URINALYSIS, ROUTINE W REFLEX MICROSCOPIC - Abnormal; Notable for the following components:   Protein, ur 30 (*)    All other components within normal limits  CBG MONITORING, ED - Abnormal; Notable for the following components:   Glucose-Capillary 121 (*)    All other components within normal limits  PROTIME-INR   APTT  DIFFERENTIAL    EKG EKG Interpretation Date/Time:  Sunday January 26 2023 10:45:22 EDT Ventricular Rate:  68 PR Interval:  175 QRS Duration:  92 QT Interval:  400 QTC Calculation: 426 R Axis:   55  Text Interpretation: Sinus rhythm Consider anterior infarct Minimal ST elevation, lateral leads no wpw, prolonged qt or brugada Otherwise no significant change Confirmed by Melene Plan [phone]) on 01/26/2023 11:11:17 AM                    Radiology CT HEAD CODE STROKE WO CONTRAST  Addendum Date: 01/26/2023   ADDENDUM REPORT: 01/26/2023 11:03 ADDENDUM: Study discussed by telephone with Dr. Jesusita Oka Tamsen Reist on 01/26/2023 at 1049 hours. Electronically Signed   By: Odessa Fleming M.D.   On: 01/26/2023 11:03   Result Date: 01/26/2023 CLINICAL DATA:  Code stroke. 62 year old male right upper extremity deficit. EXAM: CT HEAD WITHOUT CONTRAST TECHNIQUE: Contiguous axial images were obtained from the base of the skull through the vertex without intravenous contrast. RADIATION DOSE REDUCTION: This exam was performed according to the departmental dose-optimization program which includes automated exposure control, adjustment of the mA and/or kV according to patient size and/or use of iterative reconstruction technique. COMPARISON:  Brain MRI 01/27/2020. FINDINGS: Brain: Cerebral volume is within normal limits for age. No midline shift, ventriculomegaly, mass effect, evidence of mass lesion, intracranial hemorrhage or evidence of cortically based acute infarction. Gray-white matter differentiation is within normal limits throughout the brain. Vascular: Calcified atherosclerosis at the skull base. No suspicious intracranial vascular hyperdensity. Skull: No acute osseous abnormality identified. Sinuses/Orbits: Minor maxillary sinus mucosal thickening on the right otherwise well aerated. Other: No gaze deviation. No acute orbit or scalp soft tissue finding. ASPECTS [facility] Stroke Program Early CT Score)  Total score (0-10 with 10 being normal): 10 IMPRESSION: Normal for age noncontrast CT appearance of the brain.  ASPECTS 10. Electronically Signed: By: Odessa Fleming M.D. On: 01/26/2023 10:45    Procedures .1-3 Lead EKG Interpretation  Performed by: Melene Plan, DO Authorized by: Melene Plan, DO     Interpretation: abnormal     ECG rate:  30   ECG rate assessment: bradycardic     Rhythm: sinus bradycardia     Ectopy: none     Conduction: normal   .Critical Care  Performed by: Melene Plan, DO Authorized by: Melene Plan, DO   Critical care provider statement:    Critical care time (minutes):  35   Critical care time was exclusive of:  Separately billable procedures and treating other patients  Critical care was time spent personally by me on the following activities:  Development of treatment plan with patient or surrogate, discussions with consultants, evaluation of patient's response to treatment, examination of patient, ordering and review of laboratory studies, ordering and review of radiographic studies, ordering and performing treatments and interventions, pulse oximetry, re-evaluation of patient's condition and review of old charts   Care discussed with: admitting provider       Medications Ordered in ED Medications - No data to display  ED Course/ Medical Decision Making/ A&P                                 Medical Decision Making Amount and/or Complexity of Data Reviewed Labs: ordered. Radiology: ordered.  Risk Decision regarding hospitalization.   62 yo M with a chief complaints of an episode where he lost consciousness followed by some tingling to his right arm and a change to his right visual field.  He is made a code stroke in triage.  I saw him after he did return from CT.  Still having some tingling and concern for right side of his vision.  Being seen by teleneurology.  I discussed the CT scan of the head with the radiologist.  No obvious intracranial hemorrhage.  I  discussed case with Dr. Selina Cooley, neurology she felt with the rapid improvement and NIHSS score of 3 would not benefit from thrombolysis.  Recommended observation in the ER until 215 with every 30 minute neurochecks.  Medical admission.  Repeat assessment with complete resolution of symptoms.  No significant electrolyte abnormalities.  Will discuss with medicine.  Patient had an episode where he again lost consciousness.  Heart rate in the 30s.  Could be sick sinus syndrome versus vagal event.  Discussed with cardiology will evaluate upon arrival.   The patients results and plan were reviewed and discussed.   Any x-rays performed were independently reviewed by myself.   Differential diagnosis were considered with the presenting HPI.  Medications - No data to display  Vitals:   01/26/23 1045 01/26/23 1052 01/26/23 1100  BP: (!) 149/85    Pulse: 69    Resp: (!) 22    Temp:  98 F (36.7 C)   TempSrc:  Oral   SpO2: 95%    Weight:   89.4 kg    Final diagnoses:  Syncope and collapse  Right sided weakness    Admission/ observation were discussed with the admitting physician, patient and/or family and they are comfortable with the plan.         Final Clinical Impression(s) / ED Diagnoses Final diagnoses:  Syncope and collapse  Right sided weakness    Rx / DC Orders ED Discharge Orders     None         Melene Plan, DO 01/26/23 1340

## 2023-01-26 NOTE — Consult Note (Addendum)
NEUROLOGY TELECONSULTATION NOTE   Date of service: January 26, 2023 Patient Name: Paul Bird MRN:  [id number] DOB:  08-22-1960 Reason for consult: telestroke  Requesting Provider: Dr. Melene Plan Consult Participants: myself, patient, bedside RN, telestroke RN Location of the provider: [facility] Location of the patient: [facility]  This consult was provided via telemedicine with 2-way video and audio communication. The patient/family was informed that care would be provided in this way and agreed to receive care in this manner.   _ _ _   _ __   _ __ _ _  __ __   _ __   __ _  History of Present Illness   62 yo man w hx melanoma, mild cognitive impairment and B12 deficiency who presented after acute onset of multiple neurologic symptoms.  Last known well was 10:15 he was sitting at the breakfast table having a conversation with his wife when his eyes rolled back in his head and he stopped speaking.  He was poorly responsive for about 10 minutes and then when he tried to get up he almost fell.  At that time he was noted to have right sided weakness and numbness however this has improved significantly and he now exhibits only right arm tingling.  He also has a visual field deficit in the right upper quadrant of his right eye only (unclear chronicity).  NIH stroke scale was 3.  CT head showed no acute intracranial findings on personal review.  TNK was not administered due to mild and improving symptoms.  CTA was not performed as part of the stroke code because his exam was not concerning for LVO.   ROS   Per HPI; all other systems reviewed and are negative  Past History   The following was personally reviewed:  Past Medical History:  Diagnosis Date   Actinic keratosis 04/19/2009   Adenomatous polyp    ADHD (attention deficit hyperactivity disorder) 10/12/2013   Dysesthesia 09/08/2009   Erectile dysfunction 04/19/2009   Qualifier: Diagnosis of   By: Rita Ohara       Melanoma of skin  11/19/2017   Clark's stage I, depth 0.2 mm   Mild cognitive impairment of uncertain or unknown etiology 04/19/2022   Vitamin B12 deficiency    Past Surgical History:  Procedure Laterality Date   COLONOSCOPY     melanoma removal  2019   neck and chest    NECK SURGERY     POLYPECTOMY     ROTATOR CUFF REPAIR  2001   right   Family History  Problem Relation Age of Onset   Dementia Mother        29s   Memory loss Mother    Heart disease Father 24       ?viral myocarditis   Colon cancer Neg Hx    Colon polyps Neg Hx    Rectal cancer Neg Hx    Ulcerative colitis Neg Hx    Stomach cancer Neg Hx    Esophageal cancer Neg Hx    Social History   Socioeconomic History   Marital status: Married    Spouse name: Not on file   Number of children: Not on file   Years of education: 12   Highest education level: High school graduate  Occupational History   Occupation: Customer Relations  Tobacco Use   Smoking status: Never   Smokeless tobacco: Never  Substance and Sexual Activity   Alcohol use: Yes    Comment: once every 1-2 weeks  Drug use: No   Sexual activity: Not on file  Other Topics Concern   Not on file  Social History Narrative   Not on file   Social Determinants of Health   Financial Resource Strain: Low Risk  (04/30/2022)   Overall Financial Resource Strain (CARDIA)    Difficulty of Paying Living Expenses: Not very hard  Food Insecurity: No Food Insecurity (01/26/2023)   Hunger Vital Sign    Worried About Running Out of Food in the Last Year: Never true    Ran Out of Food in the Last Year: Never true  Transportation Needs: No Transportation Needs (01/26/2023)   PRAPARE - Administrator, Civil Service (Medical): No    Lack of Transportation (Non-Medical): No  Physical Activity: Sufficiently Active (04/30/2022)   Exercise Vital Sign    Days of Exercise per Week: 5 days    Minutes of Exercise per Session: 50 min  Stress: No Stress Concern Present  (04/30/2022)   [employer] of Occupational Health - Occupational Stress Questionnaire    Feeling of Stress : Not at all  Social Connections: Unknown (04/30/2022)   Social Connection and Isolation Panel [NHANES]    Frequency of Communication with Friends and Family: More than three times a week    Frequency of Social Gatherings with Friends and Family: More than three times a week    Attends Religious Services: Patient declined    Database administrator or Organizations: Yes    Attends Banker Meetings: Patient declined    Marital Status: Married   No Known Allergies  Medications   Medications Prior to Admission  Medication Sig Dispense Refill Last Dose   cyanocobalamin (VITAMIN B12) 1000 MCG/ML injection Inject 1,000 mcg into the muscle every Friday.   Past Week   tadalafil (CIALIS) 20 MG tablet Take one tablet by mouth every other day as needed for erectile dysfunction 10 tablet 5 UNK      Current Facility-Administered Medications:    acetaminophen (TYLENOL) tablet 650 mg, 650 mg, Oral, Q6H PRN **OR** acetaminophen (TYLENOL) suppository 650 mg, 650 mg, Rectal, Q6H PRN, Synetta Fail, MD   polyethylene glycol (MIRALAX / GLYCOLAX) packet 17 g, 17 g, Oral, Daily PRN, Synetta Fail, MD   sodium chloride flush (NS) 0.9 % injection 3 mL, 3 mL, Intravenous, Q12H, Synetta Fail, MD  Vitals   Vitals:   01/26/23 1045 01/26/23 1052 01/26/23 1100 01/26/23 1531  BP: (!) 149/85   (!) 141/90  Pulse: 69   75  Resp: (!) 22   17  Temp:  98 F (36.7 C)  97.6 F (36.4 C)  TempSrc:  Oral  Oral  SpO2: 95%   98%  Weight:   89.4 kg      Body mass index is 25.99 kg/m.  Physical Exam   Exam performed over telemedicine with 2-way video and audio communication and with assistance of bedside RN  Physical Exam Gen: A&O x4, NAD Resp: normal WOB CV: extremities appear well-perfused  Neuro: *MS: A&O x4. Follows multi-step commands.  *Speech: nondysarthric,  no aphasia, able to name and repeat *CN: PERRL 3mm, EOMI, R upper quandrantopsia in R eye only, otherwise VFF, sensation intact, R NLF flattening, hearing intact to voice *Motor:   Normal bulk.  No tremor, rigidity or bradykinesia. No pronator drift. All extremities appear full-strength and symmetric. *Sensory: RUE feels "cold and tingly" otherwise SILT. Symmetric. No double-simultaneous extinction.  *Coordination:  Finger-to-nose, heel-to-shin, rapid alternating motions  were intact. *Reflexes:  UTA 2/2 tele-exam *Gait: deferred  NIHSS = 3 (VF, droop, sensory)  Premorbid mRS = 2   Labs   CBC:  Recent Labs  Lab 01/26/23 1035  WBC 7.2  NEUTROABS 4.5  HGB 15.0  HCT 46.9  MCV 80.9  PLT 211    Basic Metabolic Panel:  Lab Results  Component Value Date   NA 138 01/26/2023   K 4.6 01/26/2023   CO2 31 01/26/2023   GLUCOSE 125 (H) 01/26/2023   BUN 18 01/26/2023   CREATININE 1.06 01/26/2023   CALCIUM 9.7 01/26/2023   GFRNONAA >60 01/26/2023   Lipid Panel:  Lab Results  Component Value Date   LDLCALC 118 (H) 10/08/2019   HgbA1c: No results found for: "HGBA1C" Urine Drug Screen:     Component Value Date/Time   LABOPIA NONE DETECTED 01/26/2023 1113   COCAINSCRNUR NONE DETECTED 01/26/2023 1113   LABBENZ NONE DETECTED 01/26/2023 1113   AMPHETMU NONE DETECTED 01/26/2023 1113   THCU POSITIVE (A) 01/26/2023 1113   LABBARB NONE DETECTED 01/26/2023 1113    Alcohol Level     Component Value Date/Time   ETH 12 (H) 01/26/2023 1035    CT Head without contrast: No acute findings on personal review  Impression   This a 62 year old gentleman with a history of melanoma, mild cognitive impairment, and B12 deficiency who presented after acute onset of poor responsiveness followed by right-sided weakness and numbness as well as blurry vision.  His NIH stroke scale was 0 and he was considered too mild to treat.  No acute findings on personal review of CT head.  Episode is concerning  for stroke or TIA.  He was observed in the emergency department until 1445 without worsening symptoms.  He is now outside of the TNK window.  Recommend transfer to [facility] for stroke workup.  Recommendations   - Pt transferred to [facility] for stroke workup - Permissive HTN x48 hrs from sx onset or until stroke ruled out by MRI goal BP <220/110. PRN labetalol or hydralazine if BP above these parameters. Avoid oral antihypertensives. - MRI brain wo contrast - CTA/MRA if not already obtained - TTE w/ bubble - Check A1c and LDL + add statin per guidelines - ASA 81mg  daily + plavix 75mg  daily x21 days f/b ASA 81mg  daily monotherapy after that - q4 hr neuro checks - STAT head CT for any change in neuro exam - Tele - PT/OT/SLP - Stroke education - Amb referral to neurology upon discharge - Check B12 - rEEG  Stroke team will follow in consultation  ______________________________________________________________________   Thank you for the opportunity to take part in the care of this patient. If you have any further questions, please contact the neurology consultation attending.  Signed,  Bing Neighbors, MD [facility] [phone]  If 7pm- 7am, please page neurology on call as listed in AMION.  **Any copied and pasted documentation in this note was written by me in another application not billed for and pasted by me into this document.

## 2023-01-26 NOTE — ED Triage Notes (Signed)
20 minutes ago ,was sitting in chair, having converstation with wife, his eyes rolled back in his head, no speech, pt tried to get up after 10  minutes and almost fell, numbness lips ;hands did nt know wife the patient now exhibits right arm tingling, and right upper quadrant vision deficit. Right leg also tingling.

## 2023-01-26 NOTE — Plan of Care (Signed)
  Problem: Education: Goal: Knowledge of condition and prescribed therapy will improve Outcome: Progressing   Problem: Education: Goal: Knowledge of secondary prevention will improve (MUST DOCUMENT ALL) Outcome: Progressing Goal: Knowledge of patient specific risk factors will improve Paul Bird N/A or DELETE if not current risk factor) Outcome: Progressing   Problem: Ischemic Stroke/TIA Tissue Perfusion: Goal: Complications of ischemic stroke/TIA will be minimized Outcome: Progressing

## 2023-01-26 NOTE — Consult Note (Signed)
Cardiology Consultation:  Patient ID: Paul Bird MRN: [id number]; DOB: [birth year]  Admit date: 01/26/2023 Date of Consult: 01/26/2023  Primary Care Provider: Kristian Covey, MD Primary Cardiologist: None  Primary Electrophysiologist:  None   Patient Profile:  Paul Bird is a 62 y.o. male with a hx of ADD/memory loss who is being seen today for the evaluation of syncope at the request of Paul Cord, MD.  History of Present Illness:  [name] presents with syncope.  He reports that he had an episode this morning.  He reports he was sitting at the breakfast table.  His wife reports that he became unresponsive.  She tells me his eyes rolled back of his head.  He does not remember this.  He was briefly out.  The episode lasted several seconds.  He reports no chest pain or trouble breathing prior to the episode.  He did not fall or hit his head.  He reported some tingling in his right side as well as some trouble vision.  He went to the ER.  CT head negative.  He had another episode in the ER.  The episode is documented.  He became bradycardic.  He then had AV block.  He had no escape rhythm.  The PP interval actually lengthened.  His wife reports he has trouble breathing at night and wakes up short of breath.  He may have sleep apnea.  Also reports chest pain that wakes him up at night.  He does tell me that he took a CBD gummy last night.  He tells me he does not normally do this.  He also had alcohol in his system.  He tells me he last drank on Friday.  He tells me he does intense activity 5 to 6 days/week.  He reports he can be push-ups and calisthenic exercises.  He tells me he has no issues doing this.  At the time of my exam heart rate is in the 70s with normal intervals.  Labs are notable for serum creatinine 1.06.  Hemoglobin 15.  Platelets 211.  Alcohol level 12.  UDS positive for THC.  Heart Pathway Score:       Past Medical History: Past Medical History:   Diagnosis Date   Actinic keratosis 04/19/2009   Adenomatous polyp    ADHD (attention deficit hyperactivity disorder) 10/12/2013   Dysesthesia 09/08/2009   Erectile dysfunction 04/19/2009   Qualifier: Diagnosis of   By: Rita Ohara       Melanoma of skin 11/19/2017   Clark's stage I, depth 0.2 mm   Mild cognitive impairment of uncertain or unknown etiology 04/19/2022   Vitamin B12 deficiency     Past Surgical History: Past Surgical History:  Procedure Laterality Date   COLONOSCOPY     melanoma removal  2019   neck and chest    NECK SURGERY     POLYPECTOMY     ROTATOR CUFF REPAIR  2001   right     Home Medications:  Prior to Admission medications   Medication Sig Start Date End Date Taking? Authorizing Provider  cyanocobalamin (VITAMIN B12) 1000 MCG/ML injection Inject 1,000 mcg into the muscle every Friday.   Yes [provider]  tadalafil (CIALIS) 20 MG tablet Take one tablet by mouth every other day as needed for erectile dysfunction 04/30/22  Yes Burchette, Elberta Fortis, MD    Inpatient Medications: Scheduled Meds:  Continuous Infusions:  PRN Meds:   Allergies:    No Known Allergies  Social History:   Social History   Socioeconomic History   Marital status: Married    Spouse name: Not on file   Number of children: Not on file   Years of education: 12   Highest education level: High school graduate  Occupational History   Occupation: Customer Relations  Tobacco Use   Smoking status: Never   Smokeless tobacco: Never  Substance and Sexual Activity   Alcohol use: Yes    Comment: once every 1-2 weeks   Drug use: No   Sexual activity: Not on file  Other Topics Concern   Not on file  Social History Narrative   Not on file   Social Determinants of Health   Financial Resource Strain: Low Risk  (04/30/2022)   Overall Financial Resource Strain (CARDIA)    Difficulty of Paying Living Expenses: Not very hard  Food Insecurity: Patient Declined  (04/30/2022)   Hunger Vital Sign    Worried About Running Out of Food in the Last Year: Patient declined    Ran Out of Food in the Last Year: Patient declined  Transportation Needs: Patient Declined (04/30/2022)   PRAPARE - Administrator, Civil Service (Medical): Patient declined    Lack of Transportation (Non-Medical): Patient declined  Physical Activity: Sufficiently Active (04/30/2022)   Exercise Vital Sign    Days of Exercise per Week: 5 days    Minutes of Exercise per Session: 50 min  Stress: No Stress Concern Present (04/30/2022)   [employer] of Occupational Health - Occupational Stress Questionnaire    Feeling of Stress : Not at all  Social Connections: Unknown (04/30/2022)   Social Connection and Isolation Panel [NHANES]    Frequency of Communication with Friends and Family: More than three times a week    Frequency of Social Gatherings with Friends and Family: More than three times a week    Attends Religious Services: Patient declined    Database administrator or Organizations: Yes    Attends Banker Meetings: Patient declined    Marital Status: Married  Catering manager Violence: Not on file     Family History:    Family History  Problem Relation Age of Onset   Dementia Mother        34s   Memory loss Mother    Heart disease Father 72       ?viral myocarditis   Colon cancer Neg Hx    Colon polyps Neg Hx    Rectal cancer Neg Hx    Ulcerative colitis Neg Hx    Stomach cancer Neg Hx    Esophageal cancer Neg Hx      ROS:  All other ROS reviewed and negative. Pertinent positives noted in the HPI.     Physical Exam/Data:   Vitals:   01/26/23 1045 01/26/23 1052 01/26/23 1100 01/26/23 1531  BP: (!) 149/85   (!) 141/90  Pulse: 69   75  Resp: (!) 22   17  Temp:  98 F (36.7 C)  97.6 F (36.4 C)  TempSrc:  Oral  Oral  SpO2: 95%   98%  Weight:   89.4 kg    No intake or output data in the 24 hours ending 01/26/23 1550      01/26/2023   11:00 AM 04/30/2022    3:44 PM 09/14/2021    9:40 AM  Last 3 Weights  Weight (lbs) 197 lb 195 lb 14.4 oz 194 lb 11.2 oz  Weight (kg) 89.359 kg  88.86 kg 88.315 kg    Body mass index is 25.99 kg/m.  General: Well nourished, well developed, in no acute distress Head: Atraumatic, normal size  Eyes: PEERLA, EOMI  Neck: Supple, no JVD Endocrine: No thryomegaly Cardiac: Normal S1, S2; RRR; no murmurs, rubs, or gallops Lungs: Clear to auscultation bilaterally, no wheezing, rhonchi or rales  Abd: Soft, nontender, no hepatomegaly  Ext: No edema, pulses 2+ Musculoskeletal: No deformities, BUE and BLE strength normal and equal Skin: Warm and dry, no rashes   Neuro: Alert and oriented to person, place, time, and situation, CNII-XII grossly intact, no focal deficits  Psych: Normal mood and affect   EKG:  The EKG was personally reviewed and demonstrates: Sinus rhythm heart rate 68, normal PR interval, normal QRS interval Telemetry:  Telemetry was personally reviewed and demonstrates: Sinus rhythm 70s  Laboratory Data: High Sensitivity Troponin:  No results for input(s): "TROPONINIHS" in the last 720 hours.   Cardiac EnzymesNo results for input(s): "TROPONINI" in the last 168 hours. No results for input(s): "TROPIPOC" in the last 168 hours.  Chemistry Recent Labs  Lab 01/26/23 1035  NA 138  K 4.6  CL 103  CO2 31  GLUCOSE 125*  BUN 18  CREATININE 1.06  CALCIUM 9.7  GFRNONAA >60  ANIONGAP 4*    Recent Labs  Lab 01/26/23 1035  PROT 7.8  ALBUMIN 4.4  AST 52*  ALT 31  ALKPHOS 54  BILITOT 0.5   Hematology Recent Labs  Lab 01/26/23 1035  WBC 7.2  RBC 5.80  HGB 15.0  HCT 46.9  MCV 80.9  MCH 25.9*  MCHC 32.0  RDW 15.0  PLT 211   BNPNo results for input(s): "BNP", "PROBNP" in the last 168 hours.  DDimer No results for input(s): "DDIMER" in the last 168 hours.  Radiology/Studies:  CT HEAD CODE STROKE WO CONTRAST  Addendum Date: 01/26/2023   ADDENDUM REPORT:  01/26/2023 11:03 ADDENDUM: Study discussed by telephone with Dr. Jesusita Oka FLOYD on 01/26/2023 at 1049 hours. Electronically Signed   By: Odessa Fleming M.D.   On: 01/26/2023 11:03   Result Date: 01/26/2023 CLINICAL DATA:  Code stroke. 62 year old male right upper extremity deficit. EXAM: CT HEAD WITHOUT CONTRAST TECHNIQUE: Contiguous axial images were obtained from the base of the skull through the vertex without intravenous contrast. RADIATION DOSE REDUCTION: This exam was performed according to the departmental dose-optimization program which includes automated exposure control, adjustment of the mA and/or kV according to patient size and/or use of iterative reconstruction technique. COMPARISON:  Brain MRI 01/27/2020. FINDINGS: Brain: Cerebral volume is within normal limits for age. No midline shift, ventriculomegaly, mass effect, evidence of mass lesion, intracranial hemorrhage or evidence of cortically based acute infarction. Gray-white matter differentiation is within normal limits throughout the brain. Vascular: Calcified atherosclerosis at the skull base. No suspicious intracranial vascular hyperdensity. Skull: No acute osseous abnormality identified. Sinuses/Orbits: Minor maxillary sinus mucosal thickening on the right otherwise well aerated. Other: No gaze deviation. No acute orbit or scalp soft tissue finding. ASPECTS [facility] Stroke Program Early CT Score) Total score (0-10 with 10 being normal): 10 IMPRESSION: Normal for age noncontrast CT appearance of the brain.  ASPECTS 10. Electronically Signed: By: Odessa Fleming M.D. 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  Episode in the ER shows sinus node decreases.  He then has AV block with increasing the PP interval.  At baseline he has narrow QRS.  He has nothing to suggest high-grade conduction disease is present.  He describes eating a CBD gummy as well as alcohol,  both of which are positive on laboratory data. -He is extremely active and has a high level of activity.  Highly suspect he has high vagal tone. -Overall this is more suspicious for vagally mediated AV block.  We will keep him on telemetry, check TSH and check echo.  We will have EP evaluate him tomorrow.  However, I do not suspect he will end up needing a pacemaker. -We did discuss avoiding CBD Gummies and alcohol intake in combination.  This could be contributing. -He also likely has sleep apnea based on his history.  His wife reports he snores and wakes up short of breath with chest discomfort.     For questions or updates, please contact [facility] Please consult [email or web address] for contact info under   Signed, Gerri Spore T. Flora Lipps, MD, [facility] [place]  [facility]  01/26/2023 3:50 PM

## 2023-01-27 ENCOUNTER — Observation Stay (HOSPITAL_COMMUNITY): Payer: No Typology Code available for payment source

## 2023-01-27 DIAGNOSIS — R55 Syncope and collapse: Secondary | ICD-10-CM | POA: Diagnosis not present

## 2023-01-27 DIAGNOSIS — R569 Unspecified convulsions: Secondary | ICD-10-CM

## 2023-01-27 LAB — LIPID PANEL
Cholesterol: 188 mg/dL (ref 0–200)
HDL: 52 mg/dL (ref >40)
LDL Cholesterol: 116 mg/dL — ABNORMAL HIGH (ref 0–99)
Total CHOL/HDL Ratio: 3.6 {ratio}
Triglycerides: 99 mg/dL (ref <150)
VLDL: 20 mg/dL (ref 0–40)

## 2023-01-27 LAB — COMPREHENSIVE METABOLIC PANEL
ALT: 31 U/L (ref 0–44)
AST: 47 U/L — ABNORMAL HIGH (ref 15–41)
Albumin: 3.4 g/dL — ABNORMAL LOW (ref 3.5–5.0)
Alkaline Phosphatase: 57 U/L (ref 38–126)
Anion gap: 5 (ref 5–15)
BUN: 16 mg/dL (ref 8–23)
CO2: 30 mmol/L (ref 22–32)
Calcium: 9.1 mg/dL (ref 8.9–10.3)
Chloride: 103 mmol/L (ref 98–111)
Creatinine, Ser: 1.07 mg/dL (ref 0.61–1.24)
GFR, Estimated: >60 mL/min (ref >60)
Glucose, Bld: 128 mg/dL — ABNORMAL HIGH (ref 70–99)
Potassium: 3.7 mmol/L (ref 3.5–5.1)
Sodium: 138 mmol/L (ref 135–145)
Total Bilirubin: 0.4 mg/dL (ref 0.3–1.2)
Total Protein: 6.9 g/dL (ref 6.5–8.1)

## 2023-01-27 LAB — CBC
HCT: 44.8 % (ref 39.0–52.0)
Hemoglobin: 14.4 g/dL (ref 13.0–17.0)
MCH: 26.1 pg (ref 26.0–34.0)
MCHC: 32.1 g/dL (ref 30.0–36.0)
MCV: 81.2 fL (ref 80.0–100.0)
Platelets: 225 10*3/uL (ref 150–400)
RBC: 5.52 MIL/uL (ref 4.22–5.81)
RDW: 14.7 % (ref 11.5–15.5)
WBC: 9 10*3/uL (ref 4.0–10.5)
nRBC: 0 % (ref 0.0–0.2)

## 2023-01-27 LAB — HEMOGLOBIN A1C
Hgb A1c MFr Bld: 5.6 % (ref 4.8–5.6)
Mean Plasma Glucose: 114.02 mg/dL

## 2023-01-27 LAB — GLUCOSE, CAPILLARY: Glucose-Capillary: 130 mg/dL — ABNORMAL HIGH (ref 70–99)

## 2023-01-27 LAB — VITAMIN B12: Vitamin B-12: 2434 pg/mL — ABNORMAL HIGH (ref 180–914)

## 2023-01-27 LAB — TSH: TSH: 1.126 u[IU]/mL (ref 0.350–4.500)

## 2023-01-27 LAB — RPR: RPR Ser Ql: NONREACTIVE

## 2023-01-27 MED ORDER — ROSUVASTATIN CALCIUM 5 MG PO TABS
10.0000 mg | ORAL_TABLET | Freq: Every day | ORAL | Status: DC
  Administered 2023-01-27: 10 mg via ORAL
  Filled 2023-01-27 (×2): qty 2

## 2023-01-27 NOTE — Progress Notes (Signed)
PT Cancellation Note  Patient Details Name: COBAIN KRIKORIAN MRN: [id number] DOB: January 17, 1961   Cancelled Treatment:    Reason Eval/Treat Not Completed: PT screened, no needs identified, will sign off  Patient seen walking with OT in hallway. Denies any changes in strength or sensation. OT endorses no PT needs.    Jerolyn Center, PT Acute Rehabilitation Services  Office [phone]  Zena Amos 01/27/2023, 1:17 PM

## 2023-01-27 NOTE — Progress Notes (Signed)
2D echo attempted, therapy in room. Will try later ?

## 2023-01-27 NOTE — Progress Notes (Signed)
Mobility Specialist Progress Note;   01/27/23 1400  Mobility  Activity Ambulated independently in hallway  Level of Assistance Independent  Assistive Device None  Distance Ambulated (ft) 425 ft  Activity Response Tolerated well  Mobility Referral Yes  $Mobility charge 1 Mobility  Mobility Specialist Start Time (ACUTE ONLY) 1400  Mobility Specialist Stop Time (ACUTE ONLY) 1415  Mobility Specialist Time Calculation (min) (ACUTE ONLY) 15 min   Pt agreeable to mobility. Required no physical assistance during ambulation. Asx throughout session. Pt back in bed with all needs met.   Caesar Bookman Mobility Specialist Please contact via SecureChat or Rehab Office [phone]

## 2023-01-27 NOTE — Plan of Care (Signed)
  Problem: Education: Goal: Knowledge of General Education information will improve Description: Including pain rating scale, medication(s)/side effects and non-pharmacologic comfort measures Outcome: Progressing   Problem: Clinical Measurements: Goal: Ability to maintain clinical measurements within normal limits will improve Outcome: Progressing Goal: Will remain free from infection Outcome: Progressing Goal: Diagnostic test results will improve Outcome: Progressing Goal: Respiratory complications will improve Outcome: Progressing Goal: Cardiovascular complication will be avoided Outcome: Progressing   Problem: Activity: Goal: Risk for activity intolerance will decrease Outcome: Progressing   Problem: Nutrition: Goal: Adequate nutrition will be maintained Outcome: Progressing   Problem: Coping: Goal: Level of anxiety will decrease Outcome: Progressing   Problem: Elimination: Goal: Will not experience complications related to bowel motility Outcome: Progressing Goal: Will not experience complications related to urinary retention Outcome: Progressing   Problem: Pain Management: Goal: General experience of comfort will improve Outcome: Progressing   Problem: Safety: Goal: Ability to remain free from injury will improve Outcome: Progressing   Problem: Skin Integrity: Goal: Risk for impaired skin integrity will decrease Outcome: Progressing   Problem: Education: Goal: Knowledge of condition and prescribed therapy will improve Outcome: Progressing   Problem: Cardiac: Goal: Will achieve and/or maintain adequate cardiac output Outcome: Progressing   Problem: Physical Regulation: Goal: Complications related to the disease process, condition or treatment will be avoided or minimized Outcome: Progressing   Problem: Education: Goal: Knowledge of disease or condition will improve Outcome: Progressing Goal: Knowledge of secondary prevention will improve (MUST  DOCUMENT ALL) Outcome: Progressing Goal: Knowledge of patient specific risk factors will improve Loraine Leriche N/A or DELETE if not current risk factor) Outcome: Progressing   Problem: Ischemic Stroke/TIA Tissue Perfusion: Goal: Complications of ischemic stroke/TIA will be minimized Outcome: Progressing   Problem: Health Behavior/Discharge Planning: Goal: Ability to manage health-related needs will improve Outcome: Progressing Goal: Goals will be collaboratively established with patient/family Outcome: Progressing   Problem: Self-Care: Goal: Ability to participate in self-care as condition permits will improve Outcome: Progressing Goal: Verbalization of feelings and concerns over difficulty with self-care will improve Outcome: Progressing Goal: Ability to communicate needs accurately will improve Outcome: Progressing   Problem: Nutrition: Goal: Risk of aspiration will decrease Outcome: Progressing Goal: Dietary intake will improve Outcome: Progressing

## 2023-01-27 NOTE — Evaluation (Signed)
Occupational Therapy Evaluation Patient Details Name: Paul Bird MRN: [id number] DOB: Jan 23, 1961 Today's Date: 01/27/2023   History of Present Illness 62 yo man who presented 01/26/23 after acute onset of multiple neurologic symptoms. Syncope followed by Rt sided weakness, Rt upper quadrant visual field deficit; NIH 3; CT head no acute changes  PMH melanoma, mild cognitive impairment and B12 deficiency   Clinical Impression   Prior to this admission, patient independent, driving, working full time and works out 6 days a week. Currently, patient is back to his baseline. Patient with potential R upper quadrant deficit, however patient inconsistent in being able to visualize. OT will sign off at this time; please re-consult if further acute OT needs arise.       If plan is discharge home, recommend the following: Assist for transportation (initially)    Functional Status Assessment  Patient has had a recent decline in their functional status and demonstrates the ability to make significant improvements in function in a reasonable and predictable amount of time.  Equipment Recommendations  None recommended by OT    Recommendations for Other Services       Precautions / Restrictions Precautions Precautions: None Restrictions Weight Bearing Restrictions: No      Mobility Bed Mobility Overal bed mobility: Independent                  Transfers Overall transfer level: Independent                        Balance                                           ADL either performed or assessed with clinical judgement   ADL Overall ADL's : At baseline                                             Vision Baseline Vision/History:  (Cataract surgery) Ability to See in Adequate Light: 0 Adequate Patient Visual Report: No change from baseline Vision Assessment?: Yes Eye Alignment: Within Functional Limits Ocular Range of Motion:  Within Functional Limits Alignment/Gaze Preference: Within Defined Limits Tracking/Visual Pursuits: Able to track stimulus in all quads without difficulty Saccades: Within functional limits Convergence: Within functional limits Additional Comments: Potential R upper quadrant deficit, however patient inconsistent in being able to visualize     Perception Perception: Within Functional Limits       Praxis Praxis: [facility]       Pertinent Vitals/Pain Pain Assessment Pain Assessment: No/denies pain     Extremity/Trunk Assessment Upper Extremity Assessment Upper Extremity Assessment: Overall WFL for tasks assessed   Lower Extremity Assessment Lower Extremity Assessment: Overall WFL for tasks assessed   Cervical / Trunk Assessment Cervical / Trunk Assessment: Normal   Communication Communication Communication: No apparent difficulties   Cognition Arousal: Alert Behavior During Therapy: WFL for tasks assessed/performed Overall Cognitive Status: Within Functional Limits for tasks assessed                                       General Comments  VSS    Exercises     Shoulder Instructions  Home Living Family/patient expects to be discharged to:: Private residence Living Arrangements: Spouse/significant other Available Help at Discharge: Available 24 hours/day Type of Home: House Home Access: Stairs to enter [employer] of Steps: 2 Entrance Stairs-Rails: Left Home Layout: Two level;Full bath on main level Alternate Level Stairs-Number of Steps: 14 Alternate Level Stairs-Rails: Left Bathroom Shower/Tub: Producer, television/film/video: Standard     Home Equipment: None          Prior Functioning/Environment Prior Level of Function : Independent/Modified Independent;Driving;Working/employed             Mobility Comments: independent ADLs Comments: independent, works for [employer], works out 6x/week, driving         OT Problem List:        OT Treatment/Interventions:      OT Goals(Current goals can be found in the care plan section) Acute Rehab OT Goals Patient Stated Goal: to go home OT Goal Formulation: With patient Time For Goal Achievement: 02/10/23 Potential to Achieve Goals: Good  OT Frequency:      Co-evaluation              AM-PAC OT "6 Clicks" Daily Activity     Outcome Measure Help from another person eating meals?: None Help from another person taking care of personal grooming?: None Help from another person toileting, which includes using toliet, bedpan, or urinal?: None Help from another person bathing (including washing, rinsing, drying)?: None Help from another person to put on and taking off regular upper body clothing?: None Help from another person to put on and taking off regular lower body clothing?: None 6 Click Score: 24   End of Session    Activity Tolerance:   Patient left:    OT Visit Diagnosis: Unsteadiness on feet (R26.81)                Time: 1610-9604 OT Time Calculation (min): 14 min Charges:  OT General Charges $OT Visit: 1 Visit OT Evaluation $OT Eval Moderate Complexity: 1 Mod  Pollyann Glen E. Dhanvi Boesen, OTR/L Acute Rehabilitation Services [phone]   Cherlyn Cushing 01/27/2023, 2:25 PM

## 2023-01-27 NOTE — Progress Notes (Addendum)
PROGRESS NOTE                                                                                                                                                                                                             Patient Demographics:    Paul Bird, is a 62 y.o. male, DOB - 1960-08-06, UJW:[id number]  Outpatient Primary MD for the patient is Kristian Covey, MD    LOS - 0  Admit date - 01/26/2023    Chief Complaint  Patient presents with   Code Stroke       Brief Narrative (HPI from H&P)    62 y.o. male with medical history significant of mild cognitive impairment, ADHD presenting after syncopal event with subsequent neurologic deficits.   Patient was in normal state of health until earlier today when patient suddenly felt unwell and was noted to become diaphoretic followed by his eyes "rolling back "he then syncopized and was unconscious for 2 to 3 minutes.  He largely returned back to normal but then noted afterwards he had right sided tingling and trouble seeing in his right visual fields.  He was brought to the ER where he was seen by neurology, EKG had some conduction abnormalities for which she was seen by cardiology as well.   Subjective:    Janene Harvey today has, No headache, No chest pain, No abdominal pain - No Nausea, No new weakness tingling or numbness, no SOB   Assessment  & Plan :   Syncope and collapse with some focal right-sided tingling and numbness and visual field defect. Seen by neurology, underwent MRI brain, CTA head and neck which were negative, LDL above goal hence placed on statin, currently no focal deficits, EEG unremarkable.  Will defer further management to neurology, will be seen by PT OT as well.  Currently back to baseline.   Bradycardia with sinus pause in the ER, seen by cardiology being seen by EP as well.  Stable TSH, not on rate controlling agents, electrolytes stable, on  telemetry, echo pending, further input EP awaited, no driving instructions also provided till seen by EP outpatient and cleared.  He may require monitor upon discharge.  Case discussed with EP physician Dr. Berton Mount, obtain CTA chest as patient has some vague nighttime symptoms of chest pain and diaphoresis, also follow-up on echo.  He will be discharged with loop recorder with outpatient EP follow-up, principal suspicion seems to be of vasovagal syncope.  Dyslipidemia.  Placed on statin.  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  Confirmatory test and viral load ordered.  Will be monitored by the ID department.  Patient has been informed.  Requested to follow-up on the final results with PCP.  History of marijuana use.  Counseled to abstain.       Condition - Fair  Family Communication  :  None  Code Status :  Full  Consults  :  Neuro, Cards, EO  PUD Prophylaxis :    Procedures  :     TTE  CTA head and Neck - Non acute  MRI brain.  Nonacute.      Disposition Plan  :    Status is: Observation   DVT Prophylaxis  :    enoxaparin (LOVENOX) injection 40 mg Start: 01/26/23 1715 SCDs Start: 01/26/23 1555    Lab Results  Component Value Date   PLT 225 01/27/2023    Diet :  Diet Order             Diet regular Fluid consistency: Thin  Diet effective now                    Inpatient Medications  Scheduled Meds:  enoxaparin (LOVENOX) injection  40 mg Subcutaneous Q24H   rosuvastatin  10 mg Oral Daily   sodium chloride flush  3 mL Intravenous Q12H   Continuous Infusions: PRN Meds:.acetaminophen **OR** acetaminophen, polyethylene glycol  Antibiotics  :    Anti-infectives (From admission, onward)    None         Objective:   Vitals:   01/26/23 2337 01/27/23 0311 01/27/23 0751 01/27/23 0753  BP: 113/66 132/76 134/88 (!) 141/96  Pulse: 65 69 70 87  Resp: 20 18 16 18   Temp: 97.9 F (36.6 C) 97.9 F (36.6 C)  98.7 F (37.1 C)  TempSrc: Oral   Oral   SpO2: 95% 96% 95% 91%  Weight:        Wt Readings from Last 3 Encounters:  01/26/23 89.4 kg  04/30/22 88.9 kg  09/14/21 88.3 kg    No intake or output data in the 24 hours ending 01/27/23 1043   Physical Exam  Awake Alert, No new F.N deficits, Normal affect [place].AT,PERRAL Supple Neck, No JVD,   Symmetrical Chest wall movement, Good air movement bilaterally, CTAB RRR,No Gallops,Rubs or new Murmurs,  +ve B.Sounds, Abd Soft, No tenderness,   No Cyanosis, Clubbing or edema        Data Review:    Recent Labs  Lab 01/26/23 1035 01/27/23 0342  WBC 7.2 9.0  HGB 15.0 14.4  HCT 46.9 44.8  PLT 211 225  MCV 80.9 81.2  MCH 25.9* 26.1  MCHC 32.0 32.1  RDW 15.0 14.7  LYMPHSABS 1.7  --   MONOABS 0.6  --   EOSABS 0.4  --   BASOSABS 0.0  --     Recent Labs  Lab 01/26/23 1035 01/26/23 2035 01/27/23 0342  NA 138  --  138  K 4.6  --  3.7  CL 103  --  103  CO2 31  --  30  ANIONGAP 4*  --  5  GLUCOSE 125*  --  128*  BUN 18  --  16  CREATININE 1.06  --  1.07  AST 52*  --  47*  ALT 31  --  31  ALKPHOS 54  --  57  BILITOT 0.5  --  0.4  ALBUMIN 4.4  --  3.4*  INR 0.9  --   --   TSH  --   --  1.126  HGBA1C  --   --  5.6  MG  --  2.2  --   CALCIUM 9.7  --  9.1      Recent Labs  Lab 01/26/23 1035 01/26/23 2035 01/27/23 0342  INR 0.9  --   --   TSH  --   --  1.126  HGBA1C  --   --  5.6  MG  --  2.2  --   CALCIUM 9.7  --  9.1    --------------------------------------------------------------------------------------------------------------- Lab Results  Component Value Date   CHOL 188 01/27/2023   HDL 52 01/27/2023   LDLCALC 116 (H) 01/27/2023   LDLDIRECT 154.3 04/19/2009   TRIG 99 01/27/2023   CHOLHDL 3.6 01/27/2023    Lab Results  Component Value Date   HGBA1C 5.6 01/27/2023   Recent Labs    01/27/23 0342  TSH 1.126   Recent Labs    01/27/23 0342  VITAMINB12 2,434*    ------------------------------------------------------------------------------------------------------------------ Cardiac Enzymes No results for input(s): "CKMB", "TROPONINI", "MYOGLOBIN" in the last 168 hours.  Invalid input(s): "CK"  Micro Results No results found for this or any previous visit (from the past 240 hour(s)).  Radiology Reports CT ANGIO HEAD NECK W WO CM  Result Date: 01/26/2023 CLINICAL DATA:  Acute neurologic deficit EXAM: CT ANGIOGRAPHY HEAD AND NECK WITH AND WITHOUT CONTRAST TECHNIQUE: Multidetector CT imaging of the head and neck was performed using the standard protocol during bolus administration of intravenous contrast. Multiplanar CT image reconstructions and MIPs were obtained to evaluate the vascular anatomy. Carotid stenosis measurements (when applicable) are obtained utilizing NASCET criteria, using the distal internal carotid diameter as the denominator. RADIATION DOSE REDUCTION: This exam was performed according to the departmental dose-optimization program which includes automated exposure control, adjustment of the mA and/or kV according to patient size and/or use of iterative reconstruction technique. CONTRAST:  75mL OMNIPAQUE IOHEXOL 350 MG/ML SOLN COMPARISON:  None Available. FINDINGS: CTA NECK FINDINGS SKELETON: No acute abnormality or high grade bony spinal canal stenosis. OTHER NECK: Normal pharynx, larynx and major salivary glands. No cervical lymphadenopathy. Unremarkable thyroid gland. UPPER CHEST: No pneumothorax or pleural effusion. No nodules or masses. AORTIC ARCH: There is no calcific atherosclerosis of the aortic arch. Normal variant aortic arch branching pattern with the left vertebral artery arising independently from the aortic arch. RIGHT CAROTID SYSTEM: Normal without aneurysm, dissection or stenosis. LEFT CAROTID SYSTEM: Normal without aneurysm, dissection or stenosis. VERTEBRAL ARTERIES: Right dominant configuration. Calcific atherosclerosis at  the right vertebral artery origin without hemodynamically significant stenosis. Remainder of the right vertebral artery is widely patent. Normal non dominant left vertebral artery. CTA HEAD FINDINGS POSTERIOR CIRCULATION: --Vertebral arteries: Normal V4 segments. --Inferior cerebellar arteries: Normal. --Basilar artery: Normal. --Superior cerebellar arteries: Normal. --Posterior cerebral arteries (PCA): Normal. ANTERIOR CIRCULATION: --Intracranial internal carotid arteries: Normal. --Anterior cerebral arteries (ACA): Normal. --Middle cerebral arteries (MCA): Normal. VENOUS SINUSES: As permitted by contrast timing, patent. ANATOMIC VARIANTS: None Review of the MIP images confirms the above findings. IMPRESSION: 1. No emergent large vessel occlusion or hemodynamically significant stenosis of the head or neck. 2. Calcific atherosclerosis at the right vertebral artery origin without hemodynamically significant stenosis. Electronically Signed   By: Deatra Robinson M.D.   On: 01/26/2023 20:01   MR BRAIN WO CONTRAST  Result Date: 01/26/2023 CLINICAL DATA:  Acute neurologic deficit EXAM: MRI HEAD WITHOUT CONTRAST TECHNIQUE: Multiplanar, multiecho pulse sequences of the brain and surrounding structures were obtained without intravenous contrast. COMPARISON:  01/27/2020 FINDINGS: Brain: No acute infarct, mass effect or extra-axial collection. No acute or chronic hemorrhage. Normal white matter signal, parenchymal volume and CSF spaces. The midline structures are normal. 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 Vascular: Normal flow voids. Skull and upper cervical spine: Normal calvarium and skull base. Visualized upper cervical spine and soft tissues are normal. Sinuses/Orbits:No paranasal sinus fluid levels or advanced mucosal thickening. No mastoid or middle ear effusion. Normal orbits. IMPRESSION: 1. No acute intracranial abnormality. 2. 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 Electronically  Signed   By: Deatra Robinson M.D.   On: 01/26/2023 19:07   CT HEAD CODE STROKE WO CONTRAST  Addendum Date: 01/26/2023   ADDENDUM REPORT: 01/26/2023 11:03 ADDENDUM: Study discussed by telephone with Dr. Jesusita Oka FLOYD on 01/26/2023 at 1049 hours. Electronically Signed   By: Odessa Fleming M.D.   On: 01/26/2023 11:03   Result Date: 01/26/2023 CLINICAL DATA:  Code stroke. 62 year old male right upper extremity deficit. EXAM: CT HEAD WITHOUT CONTRAST TECHNIQUE: Contiguous axial images were obtained from the base of the skull through the vertex without intravenous contrast. RADIATION DOSE REDUCTION: This exam was performed according to the departmental dose-optimization program which includes automated exposure control, adjustment of the mA and/or kV according to patient size and/or use of iterative reconstruction technique. COMPARISON:  Brain MRI 01/27/2020. FINDINGS: Brain: Cerebral volume is within normal limits for age. No midline shift, ventriculomegaly, mass effect, evidence of mass lesion, intracranial hemorrhage or evidence of cortically based acute infarction. Gray-white matter differentiation is within normal limits throughout the brain. Vascular: Calcified atherosclerosis at the skull base. No suspicious intracranial vascular hyperdensity. Skull: No acute osseous abnormality identified. Sinuses/Orbits: Minor maxillary sinus mucosal thickening on the right otherwise well aerated. Other: No gaze deviation. No acute orbit or scalp soft tissue finding. ASPECTS [facility] Stroke Program Early CT Score) Total score (0-10 with 10 being normal): 10 IMPRESSION: Normal for age noncontrast CT appearance of the brain.  ASPECTS 10. Electronically Signed: By: Odessa Fleming M.D. On: 01/26/2023 10:45      Signature  -   Susa Raring M.D on 01/27/2023 at 10:43 AM   -  To page go to [email or web address]

## 2023-01-27 NOTE — Progress Notes (Addendum)
EEG complete - results pending 

## 2023-01-27 NOTE — TOC CAGE-AID Note (Signed)
Transition of Care [facility]) - CAGE-AID Screening   Patient Details  Name: Paul Bird MRN: [id number] Date of Birth: Jan 09, 1961  Transition of Care [facility]) CM/SW Contact:    Michaela Corner, LCSWA Phone Number: 01/27/2023, 11:10 AM   Clinical Narrative: CSW met pt and wife at bedside and completed CAGE AID. Pt reports he has no substance use problem and does not need resources.   CAGE-AID Screening:    Have You Ever Felt You Ought to Cut Down on Your Drinking or Drug Use?: No Have People Annoyed You By Critizing Your Drinking Or Drug Use?: No Have You Felt Bad Or Guilty About Your Drinking Or Drug Use?: No Have You Ever Had a Drink or Used Drugs First Thing In The Morning to Steady Your Nerves or to Get Rid of a Hangover?: No CAGE-AID Score: 0  Substance Abuse Education Offered: Yes  Substance abuse interventions: Other (must comment) (Pt declined resources)

## 2023-01-27 NOTE — Progress Notes (Signed)
SLP Cancellation Note  Patient Details Name: PINK MOREIN MRN: [id number] DOB: [birth year]   Cancelled treatment:       Reason Eval/Treat Not Completed: SLP screened, no needs identified, will sign off. Per OT, pt, and pt's wife, pt is at his baseline with no SLP needs.   Gwynneth Aliment, M.A., CF-SLP Speech Language Pathology, Acute Rehabilitation Services  Secure Chat preferred [phone]  01/27/2023, 1:31 PM

## 2023-01-27 NOTE — Discharge Instructions (Signed)
Do not drive, operate heavy machinery, perform activities at heights, swimming or participation in water activities or provide baby sitting services until you have seen by the Cardiologist and advised to do so again.  Follow with Primary MD Kristian Covey, MD in 7 days, follow for the final HIV results and the results of echocardiogram and CT, follow-up with the recommended cardiologist within a week for your loop recorder.  Get CBC, CMP, 2 view Chest X ray -  checked next visit with your primary MD    Activity: As tolerated with Full fall precautions use walker/cane & assistance as needed  Disposition Home    Diet: Heart Healthy   Special Instructions: If you have smoked or chewed Tobacco  in the last 2 yrs please stop smoking, stop any regular Alcohol  and or any Recreational drug use.  On your next visit with your primary care physician please Get Medicines reviewed and adjusted.  Please request your Prim.MD to go over all Hospital Tests and Procedure/Radiological results at the follow up, please get all Hospital records sent to your Prim MD by signing hospital release before you go home.  If you experience worsening of your admission symptoms, develop shortness of breath, life threatening emergency, suicidal or homicidal thoughts you must seek medical attention immediately by calling 911 or calling your MD immediately  if symptoms less severe.  You Must read complete instructions/literature along with all the possible adverse reactions/side effects for all the Medicines you take and that have been prescribed to you. Take any new Medicines after you have completely understood and accpet all the possible adverse reactions/side effects.   Do not drive when taking Pain medications.  Do not take more than prescribed Pain, Sleep and Anxiety Medications

## 2023-01-27 NOTE — Progress Notes (Addendum)
STROKE TEAM PROGRESS NOTE   SUBJECTIVE (INTERVAL HISTORY) His wife is at the bedside.  Overall his condition is completely resolved. Pt and his wife recounted HPI with me.  He took Cialis the night before (the second time use it, first time he felt wired too). In the morning, he was talking with his wife at the breakfast table, suddenly he leaning backwards, his eyes rolling back, slurry speech, garbled speech, pale, lack of responsiveness. He was trying to get up to walk but very wobbly, so wife got him into recliner. He was still more blank stares, feeling b/l hand cold clammy and tingling, diaphoresis. After 10-36min, he was much better and got back to lay down in his bed. He refused to go to hospital but his wife later drove him to ED.   In ED, he also had an episode of difficulty breathing, chest tightness, panic looking, wife called RN and found his HR was 27 on tele. Episodes lasted about 1-2 min resolved. Pt stated that he has some episodes like this at home, more so when gets to bed at night and one time was in the kitchen lately.   He has hx of cognitive impairment. Saw neuropsych and had neuropsych testing which showed cognitive impairment but also found to have B12 deficiency. After B12 supplement, pt felt better cognition but wife did not see significant improvement.    OBJECTIVE Temp:  [97.6 F (36.4 C)-98.7 F (37.1 C)] 98 F (36.7 C) (10/28 1101) Pulse Rate:  [65-87] 87 (10/28 0753) Cardiac Rhythm: Normal sinus rhythm (10/28 0700) Resp:  [14-20] 18 (10/28 0753) BP: (113-151)/(66-96) 151/88 (10/28 1101) SpO2:  [91 %-98 %] 91 % (10/28 0753)  Recent Labs  Lab 01/26/23 1043 01/27/23 0411  GLUCAP 121* 130*   Recent Labs  Lab 01/26/23 1035 01/26/23 2035 01/27/23 0342  NA 138  --  138  K 4.6  --  3.7  CL 103  --  103  CO2 31  --  30  GLUCOSE 125*  --  128*  BUN 18  --  16  CREATININE 1.06  --  1.07  CALCIUM 9.7  --  9.1  MG  --  2.2  --    Recent Labs  Lab  01/26/23 1035 01/27/23 0342  AST 52* 47*  ALT 31 31  ALKPHOS 54 57  BILITOT 0.5 0.4  PROT 7.8 6.9  ALBUMIN 4.4 3.4*   Recent Labs  Lab 01/26/23 1035 01/27/23 0342  WBC 7.2 9.0  NEUTROABS 4.5  --   HGB 15.0 14.4  HCT 46.9 44.8  MCV 80.9 81.2  PLT 211 225   No results for input(s): "CKTOTAL", "CKMB", "CKMBINDEX", "TROPONINI" in the last 168 hours. Recent Labs    01/26/23 1035  LABPROT 12.4  INR 0.9   Recent Labs    01/26/23 1113  COLORURINE YELLOW  LABSPEC 1.030  PHURINE 6.5  GLUCOSEU NEGATIVE  HGBUR NEGATIVE  BILIRUBINUR NEGATIVE  KETONESUR NEGATIVE  PROTEINUR 30*  NITRITE NEGATIVE  LEUKOCYTESUR NEGATIVE       Component Value Date/Time   CHOL 188 01/27/2023 0342   TRIG 99 01/27/2023 0342   HDL 52 01/27/2023 0342   CHOLHDL 3.6 01/27/2023 0342   VLDL 20 01/27/2023 0342   LDLCALC 116 (H) 01/27/2023 0342   LDLCALC 118 (H) 10/08/2019 1144   Lab Results  Component Value Date   HGBA1C 5.6 01/27/2023      Component Value Date/Time   LABOPIA NONE DETECTED 01/26/2023 1113  COCAINSCRNUR NONE DETECTED 01/26/2023 1113   LABBENZ NONE DETECTED 01/26/2023 1113   AMPHETMU NONE DETECTED 01/26/2023 1113   THCU POSITIVE (A) 01/26/2023 1113   LABBARB NONE DETECTED 01/26/2023 1113    Recent Labs  Lab 01/26/23 1035  ETH 12*    I have personally reviewed the radiological images below and agree with the radiology interpretations.  EEG adult  Result Date: 01/27/2023 Paul Quest, MD     01/27/2023 10:54 AM Patient Name: Paul Bird MRN: [id number] Epilepsy Attending: Charlsie Bird Referring Physician/Provider: Marvel Plan, MD Date: 01/27/2023 Duration: 22.38 mins Patient history: 62 yo man w hx melanoma, mild cognitive impairment and B12 deficiency who presented after acute onset of multiple neurologic symptoms.  Last known well was 10:15 he was sitting at the breakfast table having a conversation with his wife when his eyes rolled back in his head and he  stopped speaking.  He was poorly responsive for about 10 minutes and then when he tried to get up he almost fell.  At that time he was noted to have right sided weakness and numbness however this has improved significantly and he now exhibits only right arm tingling.  He also has a visual field deficit in the right upper quadrant of his right eye only (unclear chronicity). EEG to evaluate for seizure Level of alertness: Awake,asleep AEDs during EEG study: None Technical aspects: This EEG study was done with scalp electrodes positioned according to the 10-20 International system of electrode placement. Electrical activity was reviewed with band pass filter of 1-70Hz , sensitivity of 7 uV/mm, display speed of 62mm/sec with a 60Hz  notched filter applied as appropriate. EEG data were recorded continuously and digitally stored.  Video monitoring was available and reviewed as appropriate. Description: The posterior dominant rhythm consists of 8-9 Hz activity of moderate voltage (25-35 uV) seen predominantly in posterior head regions, symmetric and reactive to eye opening and eye closing. Sleep was characterized by vertex waves, sleep spindles (12 to 14 Hz), maximal frontocentral region. Hyperventilation and photic stimulation were not performed.   IMPRESSION: This study is within normal limits. No seizures or epileptiform discharges were seen throughout the recording. A normal interictal EEG does not exclude the diagnosis of epilepsy. Paul Bird   CT ANGIO HEAD NECK W WO CM  Result Date: 01/26/2023 CLINICAL DATA:  Acute neurologic deficit EXAM: CT ANGIOGRAPHY HEAD AND NECK WITH AND WITHOUT CONTRAST TECHNIQUE: Multidetector CT imaging of the head and neck was performed using the standard protocol during bolus administration of intravenous contrast. Multiplanar CT image reconstructions and MIPs were obtained to evaluate the vascular anatomy. Carotid stenosis measurements (when applicable) are obtained utilizing  NASCET criteria, using the distal internal carotid diameter as the denominator. RADIATION DOSE REDUCTION: This exam was performed according to the departmental dose-optimization program which includes automated exposure control, adjustment of the mA and/or kV according to patient size and/or use of iterative reconstruction technique. CONTRAST:  75mL OMNIPAQUE IOHEXOL 350 MG/ML SOLN COMPARISON:  None Available. FINDINGS: CTA NECK FINDINGS SKELETON: No acute abnormality or high grade bony spinal canal stenosis. OTHER NECK: Normal pharynx, larynx and major salivary glands. No cervical lymphadenopathy. Unremarkable thyroid gland. UPPER CHEST: No pneumothorax or pleural effusion. No nodules or masses. AORTIC ARCH: There is no calcific atherosclerosis of the aortic arch. Normal variant aortic arch branching pattern with the left vertebral artery arising independently from the aortic arch. RIGHT CAROTID SYSTEM: Normal without aneurysm, dissection or stenosis. LEFT CAROTID SYSTEM: Normal without aneurysm, dissection  or stenosis. VERTEBRAL ARTERIES: Right dominant configuration. Calcific atherosclerosis at the right vertebral artery origin without hemodynamically significant stenosis. Remainder of the right vertebral artery is widely patent. Normal non dominant left vertebral artery. CTA HEAD FINDINGS POSTERIOR CIRCULATION: --Vertebral arteries: Normal V4 segments. --Inferior cerebellar arteries: Normal. --Basilar artery: Normal. --Superior cerebellar arteries: Normal. --Posterior cerebral arteries (PCA): Normal. ANTERIOR CIRCULATION: --Intracranial internal carotid arteries: Normal. --Anterior cerebral arteries (ACA): Normal. --Middle cerebral arteries (MCA): Normal. VENOUS SINUSES: As permitted by contrast timing, patent. ANATOMIC VARIANTS: None Review of the MIP images confirms the above findings. IMPRESSION: 1. No emergent large vessel occlusion or hemodynamically significant stenosis of the head or neck. 2. Calcific  atherosclerosis at the right vertebral artery origin without hemodynamically significant stenosis. Electronically Signed   By: Deatra Robinson M.D.   On: 01/26/2023 20:01   MR BRAIN WO CONTRAST  Result Date: 01/26/2023 CLINICAL DATA:  Acute neurologic deficit EXAM: MRI HEAD WITHOUT CONTRAST TECHNIQUE: Multiplanar, multiecho pulse sequences of the brain and surrounding structures were obtained without intravenous contrast. COMPARISON:  01/27/2020 FINDINGS: Brain: No acute infarct, mass effect or extra-axial collection. No acute or chronic hemorrhage. Normal white matter signal, parenchymal volume and CSF spaces. The midline structures are normal. 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 Vascular: Normal flow voids. Skull and upper cervical spine: Normal calvarium and skull base. Visualized upper cervical spine and soft tissues are normal. Sinuses/Orbits:No paranasal sinus fluid levels or advanced mucosal thickening. No mastoid or middle ear effusion. Normal orbits. IMPRESSION: 1. No acute intracranial abnormality. 2. 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 Electronically Signed   By: Deatra Robinson M.D.   On: 01/26/2023 19:07   CT HEAD CODE STROKE WO CONTRAST  Addendum Date: 01/26/2023   ADDENDUM REPORT: 01/26/2023 11:03 ADDENDUM: Study discussed by telephone with Dr. Jesusita Oka FLOYD on 01/26/2023 at 1049 hours. Electronically Signed   By: Odessa Fleming M.D.   On: 01/26/2023 11:03   Result Date: 01/26/2023 CLINICAL DATA:  Code stroke. 62 year old male right upper extremity deficit. EXAM: CT HEAD WITHOUT CONTRAST TECHNIQUE: Contiguous axial images were obtained from the base of the skull through the vertex without intravenous contrast. RADIATION DOSE REDUCTION: This exam was performed according to the departmental dose-optimization program which includes automated exposure control, adjustment of the mA and/or kV according to patient size and/or use of iterative reconstruction technique.  COMPARISON:  Brain MRI 01/27/2020. FINDINGS: Brain: Cerebral volume is within normal limits for age. No midline shift, ventriculomegaly, mass effect, evidence of mass lesion, intracranial hemorrhage or evidence of cortically based acute infarction. Gray-white matter differentiation is within normal limits throughout the brain. Vascular: Calcified atherosclerosis at the skull base. No suspicious intracranial vascular hyperdensity. Skull: No acute osseous abnormality identified. Sinuses/Orbits: Minor maxillary sinus mucosal thickening on the right otherwise well aerated. Other: No gaze deviation. No acute orbit or scalp soft tissue finding. ASPECTS [facility] Stroke Program Early CT Score) Total score (0-10 with 10 being normal): 10 IMPRESSION: Normal for age noncontrast CT appearance of the brain.  ASPECTS 10. Electronically Signed: By: Odessa Fleming M.D. On: 01/26/2023 10:45     PHYSICAL EXAM  Temp:  [97.6 F (36.4 C)-98.7 F (37.1 C)] 98 F (36.7 C) (10/28 1101) Pulse Rate:  [65-87] 87 (10/28 0753) Resp:  [14-20] 18 (10/28 0753) BP: (113-151)/(66-96) 151/88 (10/28 1101) SpO2:  [91 %-98 %] 91 % (10/28 0753)  General - Well nourished, well developed, in no apparent distress.  Ophthalmologic - fundi not visualized due to noncooperation.  Cardiovascular - Regular rhythm and rate.  Mental Status -  Level of arousal and orientation to time, place, and person were intact. Language including expression, naming, repetition, comprehension was assessed and found intact. Attention span and concentration were normal. Recent and remote memory were intact. Fund of Knowledge was assessed and was intact.  Cranial Nerves II - XII - II - Visual field intact OU. III, IV, VI - Extraocular movements intact. V - Facial sensation intact bilaterally. VII - Facial movement intact bilaterally. VIII - Hearing & vestibular intact bilaterally. X - Palate elevates symmetrically. XI - Chin turning & shoulder shrug intact  bilaterally. XII - Tongue protrusion intact.  Motor Strength - The patient's strength was normal in all extremities and pronator drift was absent.  Bulk was normal and fasciculations were absent.   Motor Tone - Muscle tone was assessed at the neck and appendages and was normal.  Reflexes - The patient's reflexes were symmetrical in all extremities and he had no pathological reflexes.  Sensory - Light touch, temperature/pinprick were assessed and were symmetrical.    Coordination - The patient had normal movements in the hands and feet with no ataxia or dysmetria.  Tremor was absent.  Gait and Station - deferred.   ASSESSMENT/PLAN Paul Bird is a 62 y.o. male with history of MCI and B12 deficiency admitted for episode of AMS, pale, diaphoresis, slurry speech. No tPA given due to symptoms resolved.    AMS - DDx including Syncope - AMS with pale, diaphoresis, chest tightness, bradycardia, b/l hand tingling, orthostatic tachycardia - discussed with EP Dr. Graciela Husbands will put on loop Seizure - eyes rolling back, blank stares, mouth and b/l hand abnormal movements - EEG negative Lewy body dementia - hx of cognitive impairment in young patient, episodes of AMS. However, pt had no parkinsonian features - refer to Dr. Arbutus Leas at [facility] Medication adverse effect - taking Hims Cialis the night before, pt does have hx of medication sensitivity - recommend to avoid using  CT no acute finding CTA head and  neck unremarkable. MRI  no acute infarct. Questionable old left cerebellar cortical infarct EEG normal limits 2D Echo  pending Recommend loop recorder per cardiology EP LDL 116 HgbA1c 5.6 UDS positive for THC lovenox for VTE prophylaxis No antithrombotic prior to admission, now on No antithrombotic. Antiplatelet per cardiology if needed. No AEDs recommended at this time. If recurrent, may consider Ongoing aggressive stroke risk factor management Therapy recommendations:  none Disposition:   home. Refer to Dr. Arbutus Leas at [facility]  Chest tightness Bracdycardia Orthostatic tachycardia Cardiology on board CT coronary pending Recommend loop recorder  Hypertension Stable Long term BP goal normotensive Home BP monitoring and PCP follow up  Hyperlipidemia Home meds:  none  LDL 116, goal < 70 Now on crestor 10 Continue statin at discharge  Other Stroke Risk Factors THC abuse - cessation education provided.  Other Active Problems B12 deficiency - on supplement ED - Cialis use - recommend to avoid using Cognitive impairment in young with abnormal neuropsych testing and episodes of AMS - refer to Dr. [facility]butus Leas at [facility] to rule out lewy body dementia  Hospital day # 0   Marvel Plan, MD PhD Stroke Neurology 01/27/2023 1:11 PM  I discussed with Dr. Graciela Husbands and Dr. Thedore Mins. I spent extensive face-to-face time with the patient, more than 50% of which was spent in counseling and coordination of care, reviewing test results, images and medication, and discussing the diagnosis, treatment plan and potential prognosis.  This patient's care requiresreview of multiple databases, neurological assessment, discussion with family, other specialists and medical decision making of high complexity. I had long discussion with wife and pt at bedside, updated pt current condition, treatment plan and potential prognosis, and answered all the questions. They expressed understanding and appreciation.   To contact Stroke Continuity provider, please refer to [email or web address]. After hours, contact General Neurology

## 2023-01-27 NOTE — Procedures (Signed)
Patient Name: Paul Bird  MRN: [id number]  Epilepsy Attending: Charlsie Quest  Referring Physician/Provider: Marvel Plan, MD  Date: 01/27/2023 Duration: 22.38 mins  Patient history: 62 yo man w hx melanoma, mild cognitive impairment and B12 deficiency who presented after acute onset of multiple neurologic symptoms.  Last known well was 10:15 he was sitting at the breakfast table having a conversation with his wife when his eyes rolled back in his head and he stopped speaking.  He was poorly responsive for about 10 minutes and then when he tried to get up he almost fell.  At that time he was noted to have right sided weakness and numbness however this has improved significantly and he now exhibits only right arm tingling.  He also has a visual field deficit in the right upper quadrant of his right eye only (unclear chronicity). EEG to evaluate for seizure  Level of alertness: Awake,asleep  AEDs during EEG study: None  Technical aspects: This EEG study was done with scalp electrodes positioned according to the 10-20 International system of electrode placement. Electrical activity was reviewed with band pass filter of 1-70Hz , sensitivity of 7 uV/mm, display speed of 60mm/sec with a 60Hz  notched filter applied as appropriate. EEG data were recorded continuously and digitally stored.  Video monitoring was available and reviewed as appropriate.  Description: The posterior dominant rhythm consists of 8-9 Hz activity of moderate voltage (25-35 uV) seen predominantly in posterior head regions, symmetric and reactive to eye opening and eye closing. Sleep was characterized by vertex waves, sleep spindles (12 to 14 Hz), maximal frontocentral region. Hyperventilation and photic stimulation were not performed.     IMPRESSION: This study is within normal limits. No seizures or epileptiform discharges were seen throughout the recording.  A normal interictal EEG does not exclude the diagnosis of  epilepsy.  Lashann Hagg Annabelle Harman

## 2023-01-27 NOTE — Consult Note (Addendum)
ELECTROPHYSIOLOGY CONSULT NOTE    Patient ID: Paul Bird MRN: [id number], DOB/AGE: Oct 31, 1960 62 y.o.  Admit date: 01/26/2023 Date of Consult: 01/27/2023  Primary Physician: Kristian Covey, MD Primary Cardiologist: None  Electrophysiologist: Dr. Graciela Husbands, new consult 01/27/23  Referring Provider: Dr. Thedore Mins  Patient Profile: Paul Bird is a 62 y.o. male with a history of ADD, memory loss who is being seen today for the evaluation of syncope at the request of Dr. Thedore Mins.  HPI:  [name] is a 62 y.o. male with PMH of ADD, memory loss admitted 10/27 for syncope.   His wife observed him in the chair to eat breakfast.  They were talking and then she asked a question and he didn't answer.  She saw him to be pale & sweaty with his head back and his arms went up into the air.  He was mumbling and incoherent. He tried to stand and almost fell. They got him up and he wobbled with assistance and she put him in a recliner.  In the chair, he was making abnormal facial movements with his mouth "like he couldn't feel his lips" and his fingers as well. He did not fall or hit his head.  He finally woke and kept repeating "I'm fine" over and again. EMS activated. Pt ruled out for stroke on admission with negative CT head. 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 He was witnessed to have P-P slowing on telemetry and then AV block. He was seen by General Cardiology.  THC and ETOH positive on admission. He had 2 glasses of wine on Friday evening.  He recently has had several episodes where he clutched his chest and became diaphoretic - telling his wife it was pressure and it resolved spontaneously. He will often get up in the middle of the night with similar episodes and have to go sit in chair for pain/sweating.  He works out regularly with [employer] 5 days a week.  Denies known feeling of heart racing.   Reports he was worked up by his PCP  for his memory issues.  He saw Neurology with largely negative work up. His PCP identified he had B12 deficiency and feels his memory has been improved since taking it.   He currently denies chest pain, palpitations, dyspnea, PND, orthopnea, nausea, vomiting, dizziness, syncope, edema, weight gain, or early satiety.   Labs Potassium3.7 (10/28 1478) Magnesium  2.2 (10/27 2035) Creatinine, ser  1.07 (10/28 0342) PLT  225 (10/28 0342) HGB  14.4 (10/28 0342) WBC 9.0 (10/28 0342)  .    Past Medical History:  Diagnosis Date   Actinic keratosis 04/19/2009   Adenomatous polyp    ADHD (attention deficit hyperactivity disorder) 10/12/2013   Dysesthesia 09/08/2009   Erectile dysfunction 04/19/2009   Qualifier: Diagnosis of   By: Rita Ohara       Melanoma of skin 11/19/2017   Clark's stage I, depth 0.2 mm   Mild cognitive impairment of uncertain or unknown etiology 04/19/2022   Vitamin B12 deficiency      Surgical History:  Past Surgical History:  Procedure Laterality Date   COLONOSCOPY     melanoma removal  2019   neck and chest    NECK SURGERY     POLYPECTOMY     ROTATOR CUFF REPAIR  2001   right     Medications Prior to Admission  Medication Sig Dispense Refill Last Dose   cyanocobalamin (  VITAMIN B12) 1000 MCG/ML injection Inject 1,000 mcg into the muscle every Friday.   Past Week   tadalafil (CIALIS) 20 MG tablet Take one tablet by mouth every other day as needed for erectile dysfunction 10 tablet 5 UNK    Inpatient Medications:   enoxaparin (LOVENOX) injection  40 mg Subcutaneous Q24H   rosuvastatin  10 mg Oral Daily   sodium chloride flush  3 mL Intravenous Q12H    Allergies: No Known Allergies  Family History  Problem Relation Age of Onset   Dementia Mother        18s   Memory loss Mother    Heart disease Father 39       ?viral myocarditis   Colon cancer Neg Hx    Colon polyps Neg Hx    Rectal cancer Neg Hx    Ulcerative colitis Neg Hx    Stomach  cancer Neg Hx    Esophageal cancer Neg Hx      Physical Exam: Vitals:   01/26/23 2337 01/27/23 0311 01/27/23 0751 01/27/23 0753  BP: 113/66 132/76 134/88 (!) 141/96  Pulse: 65 69 70 87  Resp: 20 18 16 18   Temp: 97.9 F (36.6 C) 97.9 F (36.6 C)  98.7 F (37.1 C)  TempSrc: Oral   Oral  SpO2: 95% 96% 95% 91%  Weight:        GEN- NAD, A&O x 3, normal affect HEENT: Normocephalic, atraumatic Lungs- CTAB, Normal effort.  Heart- Regular rate and rhythm, No M/G/R. HR on tele at rest 73 > stood up and reached 118 bpm GI- Soft, NT, ND.  Extremities- No clubbing, cyanosis, or edema   Radiology/Studies: CT ANGIO HEAD NECK W WO CM  Result Date: 01/26/2023 CLINICAL DATA:  Acute neurologic deficit EXAM: CT ANGIOGRAPHY HEAD AND NECK WITH AND WITHOUT CONTRAST TECHNIQUE: Multidetector CT imaging of the head and neck was performed using the standard protocol during bolus administration of intravenous contrast. Multiplanar CT image reconstructions and MIPs were obtained to evaluate the vascular anatomy. Carotid stenosis measurements (when applicable) are obtained utilizing NASCET criteria, using the distal internal carotid diameter as the denominator. RADIATION DOSE REDUCTION: This exam was performed according to the departmental dose-optimization program which includes automated exposure control, adjustment of the mA and/or kV according to patient size and/or use of iterative reconstruction technique. CONTRAST:  75mL OMNIPAQUE IOHEXOL 350 MG/ML SOLN COMPARISON:  None Available. FINDINGS: CTA NECK FINDINGS SKELETON: No acute abnormality or high grade bony spinal canal stenosis. OTHER NECK: Normal pharynx, larynx and major salivary glands. No cervical lymphadenopathy. Unremarkable thyroid gland. UPPER CHEST: No pneumothorax or pleural effusion. No nodules or masses. AORTIC ARCH: There is no calcific atherosclerosis of the aortic arch. Normal variant aortic arch branching pattern with the left vertebral  artery arising independently from the aortic arch. RIGHT CAROTID SYSTEM: Normal without aneurysm, dissection or stenosis. LEFT CAROTID SYSTEM: Normal without aneurysm, dissection or stenosis. VERTEBRAL ARTERIES: Right dominant configuration. Calcific atherosclerosis at the right vertebral artery origin without hemodynamically significant stenosis. Remainder of the right vertebral artery is widely patent. Normal non dominant left vertebral artery. CTA HEAD FINDINGS POSTERIOR CIRCULATION: --Vertebral arteries: Normal V4 segments. --Inferior cerebellar arteries: Normal. --Basilar artery: Normal. --Superior cerebellar arteries: Normal. --Posterior cerebral arteries (PCA): Normal. ANTERIOR CIRCULATION: --Intracranial internal carotid arteries: Normal. --Anterior cerebral arteries (ACA): Normal. --Middle cerebral arteries (MCA): Normal. VENOUS SINUSES: As permitted by contrast timing, patent. ANATOMIC VARIANTS: None Review of the MIP images confirms the above findings. IMPRESSION: 1. No emergent large  vessel occlusion or hemodynamically significant stenosis of the head or neck. 2. Calcific atherosclerosis at the right vertebral artery origin without hemodynamically significant stenosis. Electronically Signed   By: Deatra Robinson M.D.   On: 01/26/2023 20:01   MR BRAIN WO CONTRAST  Result Date: 01/26/2023 CLINICAL DATA:  Acute neurologic deficit EXAM: MRI HEAD WITHOUT CONTRAST TECHNIQUE: Multiplanar, multiecho pulse sequences of the brain and surrounding structures were obtained without intravenous contrast. COMPARISON:  01/27/2020 FINDINGS: Brain: No acute infarct, mass effect or extra-axial collection. No acute or chronic hemorrhage. Normal white matter signal, parenchymal volume and CSF spaces. The midline structures are normal. 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 Vascular: Normal flow voids. Skull and upper cervical spine: Normal calvarium and skull base. Visualized upper cervical spine and soft  tissues are normal. Sinuses/Orbits:No paranasal sinus fluid levels or advanced mucosal thickening. No mastoid or middle ear effusion. Normal orbits. IMPRESSION: 1. No acute intracranial abnormality. 2. 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 Electronically Signed   By: Deatra Robinson M.D.   On: 01/26/2023 19:07   CT HEAD CODE STROKE WO CONTRAST  Addendum Date: 01/26/2023   ADDENDUM REPORT: 01/26/2023 11:03 ADDENDUM: Study discussed by telephone with Dr. Jesusita Oka FLOYD on 01/26/2023 at 1049 hours. Electronically Signed   By: Odessa Fleming M.D.   On: 01/26/2023 11:03   Result Date: 01/26/2023 CLINICAL DATA:  Code stroke. 62 year old male right upper extremity deficit. EXAM: CT HEAD WITHOUT CONTRAST TECHNIQUE: Contiguous axial images were obtained from the base of the skull through the vertex without intravenous contrast. RADIATION DOSE REDUCTION: This exam was performed according to the departmental dose-optimization program which includes automated exposure control, adjustment of the mA and/or kV according to patient size and/or use of iterative reconstruction technique. COMPARISON:  Brain MRI 01/27/2020. FINDINGS: Brain: Cerebral volume is within normal limits for age. No midline shift, ventriculomegaly, mass effect, evidence of mass lesion, intracranial hemorrhage or evidence of cortically based acute infarction. Gray-white matter differentiation is within normal limits throughout the brain. Vascular: Calcified atherosclerosis at the skull base. No suspicious intracranial vascular hyperdensity. Skull: No acute osseous abnormality identified. Sinuses/Orbits: Minor maxillary sinus mucosal thickening on the right otherwise well aerated. Other: No gaze deviation. No acute orbit or scalp soft tissue finding. ASPECTS [facility] Stroke Program Early CT Score) Total score (0-10 with 10 being normal): 10 IMPRESSION: Normal for age noncontrast CT appearance of the brain.  ASPECTS 10. Electronically Signed: By: Odessa Fleming M.D. 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 Noted P-P lengthening on initial strips, then AV block.  -await ECHO findings -no driving for 3 months -no role for PPM at this time  -plan for loop recorder before discharge   Paroxysmal AT  -noted on monitor as above -may be precipitant for neurocardiogenic syncope   Orthostatic Intolerance -ensure adequate hydration  -assess orthostatic BP  Chest Pressure  -assess coronary CT chest   Old Left Cerebellar Infarct  -per Neuro  -loop as above   For questions or updates, please contact [facility] Please consult [email or web address] for contact info under Cardiology/STEMI.  Signed, Canary Brim, MSN, APRN, NP-C, AGACNP-BC [facility] - Electrophysiology  01/27/2023, 10:35 AM  Loss of consciousness, head and eyes rolled back assoc with diaphoresis and pallor, followed by a  long post ictal spell, able to walk with assistance but nonpurposeful communication and some facial movements with gradual recovery.  Occurred in the temporal context of phosphodiesterase inhibitor, alcohol and THC gummi, and subsequently seen on telemetry to have atrial tach and orthostatic tachycardia. .  In ER witness to have heartblock Extremely fit, but awakens increasingly frequently with severe chest pressure, diaphoresis and dyspnea.  Reilieves with time 15-30 min   Syncope  Bradycardia with concomitant PP prolongation and AV block consistent with hypervagatonia  Atrial tachy  Memory issues  Cerebellar infarct--old  Chest pain, typical and atypical features  Atrial tachycardia.  B12 deficiency, diagnoses 2021  Suspect was Vasovagal syncope given evidence on bradycardia tracings of hypervagotonia and multiple potential  contributors, including EtOH as volume depleting, Phosphodiesterase inhibitor causing vasodilitation, THC as sympathomimetic and possible atrial tachycardia as a trigger.  I do not think pacing is indicated at this time.  Discussed the potential contributors to this spell; it is as best as he can remember ( which is questionable) his first such event.  We still do not have his echo back, and the poor R wave progression suggests a possible cardiomyopathy.  with the questions left uncertain from above, notably the possible causal relation between atrial tach and syncope have recommended Loop implant prior to discharge (presuming his echo is normal)  His chest pain syndrome is striking, in the severity of nocturnal symptoms consistent with angina decubitus but without exertional limitations.  Will undertake CTA, esp in light of abnormal ECG  His orthostatic tachycardia is impressive;  B12 deficiency is resolved, and as best as I can find is unassociated. His neurocognitive changes with memory beg the question of interplay.  I) is there a neurocognitive disease, eg parkinsons, that could explain both>> will ask neuro; II) there is a well described assoc between OI and worsening cognitive function.

## 2023-01-28 ENCOUNTER — Observation Stay (HOSPITAL_BASED_OUTPATIENT_CLINIC_OR_DEPARTMENT_OTHER): Payer: No Typology Code available for payment source

## 2023-01-28 DIAGNOSIS — R9431 Abnormal electrocardiogram [ECG] [EKG]: Secondary | ICD-10-CM | POA: Diagnosis not present

## 2023-01-28 DIAGNOSIS — R55 Syncope and collapse: Secondary | ICD-10-CM | POA: Diagnosis not present

## 2023-01-28 LAB — ECHOCARDIOGRAM COMPLETE
AR max vel: 3.02 cm2
AV Area VTI: 2.84 cm2
AV Area mean vel: 2.77 cm2
AV Mean grad: 3 mm[Hg]
AV Peak grad: 6.2 mm[Hg]
Ao pk vel: 1.24 m/s
Area-P 1/2: 3.65 cm2
Calc EF: 58.6 %
Height: 73 in
S' Lateral: 3.2 cm
Single Plane A2C EF: 56.7 %
Single Plane A4C EF: 59.2 %
Weight: 3156.99 [oz_av]

## 2023-01-28 LAB — HIV-1 RNA QUANT-NO REFLEX-BLD
HIV 1 RNA Quant: <20 {copies}/mL
LOG10 HIV-1 RNA: UNDETERMINED {Log}

## 2023-01-28 LAB — GLUCOSE, CAPILLARY: Glucose-Capillary: 115 mg/dL — ABNORMAL HIGH (ref 70–99)

## 2023-01-28 LAB — HIV ANTIBODY (ROUTINE TESTING W REFLEX): HIV Screen 4th Generation wRfx: REACTIVE — AB

## 2023-01-28 MED ORDER — METOPROLOL TARTRATE 5 MG/5ML IV SOLN
INTRAVENOUS | Status: AC
  Filled 2023-01-28: qty 10

## 2023-01-28 MED ORDER — METOPROLOL TARTRATE 5 MG/5ML IV SOLN
5.0000 mg | Freq: Once | INTRAVENOUS | Status: AC
Start: 2023-01-28 — End: 2023-01-28
  Administered 2023-01-28: 5 mg via INTRAVENOUS

## 2023-01-28 MED ORDER — NITROGLYCERIN 0.4 MG SL SUBL
0.8000 mg | SUBLINGUAL_TABLET | Freq: Once | SUBLINGUAL | Status: AC
  Administered 2023-01-28: 0.8 mg via SUBLINGUAL

## 2023-01-28 MED ORDER — ROSUVASTATIN CALCIUM 10 MG PO TABS: 10.0000 mg | ORAL_TABLET | Freq: Every day | ORAL | 0 refills | Status: DC

## 2023-01-28 MED ORDER — IOHEXOL 350 MG/ML SOLN
95.0000 mL | Freq: Once | INTRAVENOUS | Status: AC | PRN
  Administered 2023-01-28: 95 mL via INTRAVENOUS

## 2023-01-28 MED ORDER — NITROGLYCERIN 0.4 MG SL SUBL
SUBLINGUAL_TABLET | SUBLINGUAL | Status: AC
  Filled 2023-01-28: qty 2

## 2023-01-28 NOTE — Plan of Care (Signed)
  Problem: Education: Goal: Knowledge of General Education information will improve Description: Including pain rating scale, medication(s)/side effects and non-pharmacologic comfort measures Outcome: Progressing   Problem: Health Behavior/Discharge Planning: Goal: Ability to manage health-related needs will improve Outcome: Progressing   Problem: Clinical Measurements: Goal: Ability to maintain clinical measurements within normal limits will improve Outcome: Progressing Goal: Will remain free from infection Outcome: Progressing Goal: Diagnostic test results will improve Outcome: Progressing Goal: Respiratory complications will improve Outcome: Progressing Goal: Cardiovascular complication will be avoided Outcome: Progressing   Problem: Activity: Goal: Risk for activity intolerance will decrease Outcome: Progressing   Problem: Nutrition: Goal: Adequate nutrition will be maintained Outcome: Progressing   Problem: Coping: Goal: Level of anxiety will decrease Outcome: Progressing   Problem: Elimination: Goal: Will not experience complications related to bowel motility Outcome: Progressing Goal: Will not experience complications related to urinary retention Outcome: Progressing   Problem: Pain Management: Goal: General experience of comfort will improve Outcome: Progressing   Problem: Safety: Goal: Ability to remain free from injury will improve Outcome: Progressing   Problem: Skin Integrity: Goal: Risk for impaired skin integrity will decrease Outcome: Progressing   Problem: Education: Goal: Knowledge of condition and prescribed therapy will improve Outcome: Progressing   Problem: Cardiac: Goal: Will achieve and/or maintain adequate cardiac output Outcome: Progressing   Problem: Physical Regulation: Goal: Complications related to the disease process, condition or treatment will be avoided or minimized Outcome: Progressing   Problem: Education: Goal:  Knowledge of disease or condition will improve 01/28/2023 0406 by Deeann Cree, RN Outcome: Progressing 01/28/2023 0341 by Deeann Cree, RN Outcome: Progressing Goal: Knowledge of secondary prevention will improve (MUST DOCUMENT ALL) 01/28/2023 0406 by Deeann Cree, RN Outcome: Progressing 01/28/2023 0341 by Deeann Cree, RN Outcome: Progressing Goal: Knowledge of patient specific risk factors will improve Loraine Leriche N/A or DELETE if not current risk factor) 01/28/2023 0406 by Deeann Cree, RN Outcome: Progressing 01/28/2023 0341 by Deeann Cree, RN Outcome: Progressing   Problem: Ischemic Stroke/TIA Tissue Perfusion: Goal: Complications of ischemic stroke/TIA will be minimized 01/28/2023 0406 by Deeann Cree, RN Outcome: Progressing 01/28/2023 0341 by Deeann Cree, RN Outcome: Progressing   Problem: Coping: Goal: Will verbalize positive feelings about self Outcome: Progressing Goal: Will identify appropriate support needs Outcome: Progressing   Problem: Health Behavior/Discharge Planning: Goal: Ability to manage health-related needs will improve Outcome: Progressing Goal: Goals will be collaboratively established with patient/family Outcome: Progressing   Problem: Self-Care: Goal: Ability to participate in self-care as condition permits will improve Outcome: Progressing Goal: Verbalization of feelings and concerns over difficulty with self-care will improve Outcome: Progressing Goal: Ability to communicate needs accurately will improve Outcome: Progressing   Problem: Nutrition: Goal: Risk of aspiration will decrease Outcome: Progressing Goal: Dietary intake will improve Outcome: Progressing

## 2023-01-28 NOTE — Progress Notes (Signed)
Pt being discharged. Wheelchair was offered but pt refused and wanted to walk down.

## 2023-01-28 NOTE — Plan of Care (Signed)
  Problem: Education: Goal: Knowledge of General Education information will improve Description: Including pain rating scale, medication(s)/side effects and non-pharmacologic comfort measures Outcome: Progressing   Problem: Health Behavior/Discharge Planning: Goal: Ability to manage health-related needs will improve Outcome: Progressing   Problem: Clinical Measurements: Goal: Ability to maintain clinical measurements within normal limits will improve Outcome: Progressing Goal: Will remain free from infection Outcome: Progressing Goal: Diagnostic test results will improve Outcome: Progressing Goal: Respiratory complications will improve Outcome: Progressing Goal: Cardiovascular complication will be avoided Outcome: Progressing   Problem: Activity: Goal: Risk for activity intolerance will decrease Outcome: Progressing   Problem: Nutrition: Goal: Adequate nutrition will be maintained Outcome: Progressing   Problem: Coping: Goal: Level of anxiety will decrease Outcome: Progressing   Problem: Elimination: Goal: Will not experience complications related to bowel motility Outcome: Progressing Goal: Will not experience complications related to urinary retention Outcome: Progressing   Problem: Pain Management: Goal: General experience of comfort will improve Outcome: Progressing   Problem: Safety: Goal: Ability to remain free from injury will improve Outcome: Progressing   Problem: Skin Integrity: Goal: Risk for impaired skin integrity will decrease Outcome: Progressing   Problem: Education: Goal: Knowledge of condition and prescribed therapy will improve Outcome: Progressing   Problem: Cardiac: Goal: Will achieve and/or maintain adequate cardiac output Outcome: Progressing   Problem: Physical Regulation: Goal: Complications related to the disease process, condition or treatment will be avoided or minimized Outcome: Progressing   Problem: Education: Goal:  Knowledge of disease or condition will improve Outcome: Progressing Goal: Knowledge of secondary prevention will improve (MUST DOCUMENT ALL) Outcome: Progressing Goal: Knowledge of patient specific risk factors will improve Loraine Leriche N/A or DELETE if not current risk factor) Outcome: Progressing   Problem: Ischemic Stroke/TIA Tissue Perfusion: Goal: Complications of ischemic stroke/TIA will be minimized Outcome: Progressing   Problem: Coping: Goal: Will verbalize positive feelings about self Outcome: Progressing Goal: Will identify appropriate support needs Outcome: Progressing   Problem: Health Behavior/Discharge Planning: Goal: Ability to manage health-related needs will improve Outcome: Progressing Goal: Goals will be collaboratively established with patient/family Outcome: Progressing   Problem: Self-Care: Goal: Ability to participate in self-care as condition permits will improve Outcome: Progressing Goal: Verbalization of feelings and concerns over difficulty with self-care will improve Outcome: Progressing Goal: Ability to communicate needs accurately will improve Outcome: Progressing   Problem: Nutrition: Goal: Risk of aspiration will decrease Outcome: Progressing Goal: Dietary intake will improve Outcome: Progressing

## 2023-01-28 NOTE — Progress Notes (Signed)
Echocardiogram 2D Echocardiogram has been performed.  Paul Bird 01/28/2023, 8:20 AM

## 2023-01-28 NOTE — Progress Notes (Addendum)
  Patient Name: Paul Bird Date of Encounter: 01/28/2023  Primary Cardiologist: None Electrophysiologist: None  Interval Summary   The patient is doing well today.  At this time, the patient denies chest pain, shortness of breath, or any new concerns.  Vital Signs    Vitals:   01/27/23 1653 01/27/23 2009 01/27/23 2331 01/28/23 0346  BP: (!) 142/84 138/78 123/75 (!) 105/59  Pulse: 69 90 75 63  Resp: 18 20 20 17   Temp: 98.3 F (36.8 C) 98.2 F (36.8 C) 98.2 F (36.8 C) 98.3 F (36.8 C)  TempSrc:  Oral Oral Oral  SpO2: 94%     Weight:      Height:       No intake or output data in the 24 hours ending 01/28/23 0718 Filed Weights   01/26/23 1100 01/26/23 1531  Weight: 89.4 kg 89.5 kg    Physical Exam    GEN- The patient is well appearing, alert and oriented x 3 today.   Lungs- Clear to ausculation bilaterally, normal work of breathing Cardiac- Regular rate and rhythm, no murmurs, rubs or gallops GI- soft, NT, ND, + BS Extremities- no clubbing or cyanosis. No edema  Telemetry    SR 70-80's, occ elevated rates to 100 (personally reviewed)  Hospital Course    Paul Bird is a 62 y.o. male admitted 10/27 for syncope.  Work up for acute stroke negative but there is evidence of old cerebellar infarct. Tele on admit showed P-P slowing on telemetry and then AV block. THC and ETOH positive. Syncope felt to be neurocardiogenic in nature.   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 Noted P-P lengthening on initial strips, then AV block.  -await ECHO  -no driving for 3 months -no role PPM at this time  -plan for loop recorder before discharge  -avoid further THC etc   Paroxysmal AT  Noted on monitor 10/28  -loop as above -may be precipitant for syncope    Orthostatic Intolerance -ensure hydration, avoidance of excess ETOH, THC -orthostatics ordered > not done   Chest Pressure  -await coronary CT    Old Left Cerebellar  Infarct  Memory Issues  -per Neurology  -follow up with Neurology as outpt -plan for loop as above   For questions or updates, please contact [facility] Please consult [email or web address] for contact info under Cardiology/STEMI.  Signed, Canary Brim, MSN, APRN, NP-C, AGACNP-BC [facility] - Electrophysiology  01/28/2023, 7:18 AM.aa (As above) Sherryl Manges

## 2023-01-28 NOTE — Progress Notes (Signed)
Informed patient wants to leave before completion of cardiac work up.  Discussed with Dr. Graciela Husbands, will arrange for outpatient loop and coronary CT.     Canary Brim, MSN, APRN, NP-C, AGACNP-BC [facility] - Electrophysiology  01/28/2023, 9:31 AM

## 2023-01-28 NOTE — TOC Transition Note (Signed)
Transition of Care [facility]) - CM/SW Discharge Note   Patient Details  Name: Paul Bird MRN: [id number] Date of Birth: [birth year]  [facility]) CM/SW Contact:  Gordy Clement, RN Phone Number: 01/28/2023, 10:12 AM   Clinical Narrative:    Patient to dc to home today  Brief assessment reveals no needs for patient- Has PCP /is insured/ no recommendations from therapy. Spouse to transport            Patient Goals and CMS Choice      Discharge Placement                         Discharge Plan and Services Additional resources added to the After Visit Summary for                                       Social Determinants of Health (SDOH) Interventions SDOH Screenings   Food Insecurity: No Food Insecurity (01/26/2023)  Housing: Patient Declined (01/26/2023)  Transportation Needs: No Transportation Needs (01/26/2023)  Utilities: Not At Risk (01/26/2023)  Alcohol Screen: Low Risk  (04/30/2022)  Depression (PHQ2-9): Low Risk  (04/30/2022)  Financial Resource Strain: Low Risk  (04/30/2022)  Physical Activity: Sufficiently Active (04/30/2022)  Social Connections: Unknown (04/30/2022)  Stress: No Stress Concern Present (04/30/2022)  Tobacco Use: Low Risk  (01/26/2023)     Readmission Risk Interventions     No data to display

## 2023-01-28 NOTE — Discharge Summary (Addendum)
Paul Bird UJW:[id number] DOB: Nov 10, 1960 DOA: 01/26/2023  PCP: Kristian Covey, MD  Admit date: 01/26/2023  Discharge date: 01/28/2023  Admitted From: Home   Disposition:  Home   Recommendations for Outpatient Follow-up:   Follow up with PCP in 1-2 weeks  PCP Please obtain BMP/CBC, 2 view CXR in 1week,  (see Discharge instructions)   PCP Please follow up on the following pending results: Final results of HIV testing, monitor on the pending echocardiogram and CT chest results, he must follow-up with her cardiologist within a week for loop recorder placement, neurology within a month.   Home Health: None   Equipment/Devices: None  Consultations: Neuro, EP Discharge Condition: Stable    CODE STATUS: Full    Diet Recommendation: Heart Healthy    Chief Complaint  Patient presents with   Code Stroke     Brief history of present illness from the day of admission and additional interim summary    62 y.o. male with medical history significant of mild cognitive impairment, ADHD presenting after syncopal event with subsequent neurologic deficits.   Patient was in normal state of health until earlier today when patient suddenly felt unwell and was noted to become diaphoretic followed by his eyes "rolling back "he then syncopized and was unconscious for 2 to 3 minutes.  He largely returned back to normal but then noted afterwards he had right sided tingling and trouble seeing in his right visual fields.  He was brought to the ER where he was seen by neurology, EKG had some conduction abnormalities for which she was seen by cardiology as well.                                                                   Hospital Course   Syncope and collapse with some focal right-sided tingling and numbness and visual  field defect. Seen by neurology, underwent MRI brain, CTA head and neck which were negative, LDL above goal hence placed on statin, currently no focal deficits, EEG unremarkable.  Will defer further management to neurology, will be seen by PT OT as well.  Currently back to baseline.   Bradycardia with sinus pause in the ER, seen by cardiology being seen by EP as well.  Stable TSH, not on rate controlling agents, electrolytes stable, on telemetry, echo pending, further input EP awaited, no driving instructions also provided till seen by EP outpatient and cleared.  He may require monitor upon discharge.   Case discussed with EP physician Dr. Berton Mount, obtain CT chest as patient has some vague nighttime symptoms of chest pain and diaphoresis, also follow-up on echo.  He will be discharged with loop recorder with outpatient EP follow-up, principal suspicion seems to be of vasovagal syncope.  Note patient just had his CT chest  and echocardiogram, loop recorder is pending and the results of these test are pending but he minutes wife do not want to wait for the results, they want to be discharged ASAP as they have important things to tend to, they will follow with PCP and cardiology outpatient to follow-up on HIV results, results of echocardiogram, CT chest, placement of loop recorder.  He is symptom-free, ambulated multiple times in the hospital without any recurrence of symptoms.  Counseled to abstain from using Cialis, marijuana.  He has been clearly instructed not to resume driving until he has been cleared by cardiologist to do so.   Dyslipidemia.  Placed on statin.   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  Confirmatory test and viral load ordered.  Will be monitored by the ID department.  Patient has been informed.  Requested to follow-up on the final results with PCP.   History of marijuana use.  Counseled to abstain.    Discharge diagnosis     Principal Problem:   Syncope and collapse Active  Problems:   Stroke-like symptoms   Sinus pause   Bradycardia    Discharge instructions    Discharge Instructions     Ambulatory referral to Neurology   Complete by: As directed    Refer to Dr. Arbutus Leas to rule out lewy body dementia. Thanks.   Diet - low sodium heart healthy   Complete by: As directed    Discharge instructions   Complete by: As directed    Do not drive, operate heavy machinery, perform activities at heights, swimming or participation in water activities or provide baby sitting services until you have seen by the Cardiologist and advised to do so again.  Follow with Primary MD Kristian Covey, MD in 7 days, follow for the final HIV results and the results of echocardiogram and CT, follow-up with the recommended cardiologist within a week for your loop recorder.  Get CBC, CMP, 2 view Chest X ray -  checked next visit with your primary MD    Activity: As tolerated with Full fall precautions use walker/cane & assistance as needed  Disposition Home    Diet: Heart Healthy   Special Instructions: If you have smoked or chewed Tobacco  in the last 2 yrs please stop smoking, stop any regular Alcohol  and or any Recreational drug use.  On your next visit with your primary care physician please Get Medicines reviewed and adjusted.  Please request your Prim.MD to go over all Hospital Tests and Procedure/Radiological results at the follow up, please get all Hospital records sent to your Prim MD by signing hospital release before you go home.  If you experience worsening of your admission symptoms, develop shortness of breath, life threatening emergency, suicidal or homicidal thoughts you must seek medical attention immediately by calling 911 or calling your MD immediately  if symptoms less severe.  You Must read complete instructions/literature along with all the possible adverse reactions/side effects for all the Medicines you take and that have been prescribed to you. Take  any new Medicines after you have completely understood and accpet all the possible adverse reactions/side effects.   Do not drive when taking Pain medications.  Do not take more than prescribed Pain, Sleep and Anxiety Medications   Increase activity slowly   Complete by: As directed        Discharge Medications   Allergies as of 01/28/2023   No Known Allergies      Medication List     STOP taking  these medications    tadalafil 20 MG tablet Commonly known as: Cialis       TAKE these medications    cyanocobalamin 1000 MCG/ML injection Commonly known as: VITAMIN B12 Inject 1,000 mcg into the muscle every Friday.   rosuvastatin 10 MG tablet Commonly known as: CRESTOR Take 1 tablet (10 mg total) by mouth daily.         Follow-up Information     Burchette, Elberta Fortis, MD. Schedule an appointment as soon as possible for a visit in 1 week(s).   Specialty: Family Medicine Contact information: [address] [place] [address] [phone]         Duke Salvia, MD. Schedule an appointment as soon as possible for a visit in 1 week(s).   Specialty: Cardiology Contact information: [address] [place] [address] [phone]         Tat, Octaviano Batty, DO. Schedule an appointment as soon as possible for a visit in 1 month(s).   Specialty: Neurology Contact information: [address] [place]  [address] [place] [address] [phone]                 Major procedures and Radiology Reports - PLEASE review detailed and final reports thoroughly  -       EEG adult  Result Date: 01/27/2023 Charlsie Quest, MD     01/27/2023 10:54 AM Patient Name: ARCHIT KNOTH MRN: [id number] Epilepsy Attending: Charlsie Quest Referring Physician/Provider: Marvel Plan, MD Date: 01/27/2023 Duration: 22.38 mins Patient history: 62 yo man w hx melanoma, mild cognitive impairment and B12 deficiency who presented after acute onset of  multiple neurologic symptoms.  Last known well was 10:15 he was sitting at the breakfast table having a conversation with his wife when his eyes rolled back in his head and he stopped speaking.  He was poorly responsive for about 10 minutes and then when he tried to get up he almost fell.  At that time he was noted to have right sided weakness and numbness however this has improved significantly and he now exhibits only right arm tingling.  He also has a visual field deficit in the right upper quadrant of his right eye only (unclear chronicity). EEG to evaluate for seizure Level of alertness: Awake,asleep AEDs during EEG study: None Technical aspects: This EEG study was done with scalp electrodes positioned according to the 10-20 International system of electrode placement. Electrical activity was reviewed with band pass filter of 1-70Hz , sensitivity of 7 uV/mm, display speed of 67mm/sec with a 60Hz  notched filter applied as appropriate. EEG data were recorded continuously and digitally stored.  Video monitoring was available and reviewed as appropriate. Description: The posterior dominant rhythm consists of 8-9 Hz activity of moderate voltage (25-35 uV) seen predominantly in posterior head regions, symmetric and reactive to eye opening and eye closing. Sleep was characterized by vertex waves, sleep spindles (12 to 14 Hz), maximal frontocentral region. Hyperventilation and photic stimulation were not performed.   IMPRESSION: This study is within normal limits. No seizures or epileptiform discharges were seen throughout the recording. A normal interictal EEG does not exclude the diagnosis of epilepsy. Priyanka Annabelle Harman   CT ANGIO HEAD NECK W WO CM  Result Date: 01/26/2023 CLINICAL DATA:  Acute neurologic deficit EXAM: CT ANGIOGRAPHY HEAD AND NECK WITH AND WITHOUT CONTRAST TECHNIQUE: Multidetector CT imaging of the head and neck was performed using the standard protocol during bolus administration of intravenous  contrast. Multiplanar CT image reconstructions and MIPs were obtained to evaluate the vascular anatomy. Carotid stenosis measurements (when applicable) are obtained utilizing NASCET criteria, using the distal internal carotid diameter as the denominator. RADIATION DOSE REDUCTION: This exam was performed according to the departmental dose-optimization program which includes automated exposure control, adjustment of the mA and/or kV according to patient size and/or use of iterative reconstruction technique. CONTRAST:  75mL OMNIPAQUE IOHEXOL 350 MG/ML SOLN COMPARISON:  None Available. FINDINGS: CTA NECK FINDINGS SKELETON: No acute abnormality or high grade bony spinal canal stenosis. OTHER NECK: Normal pharynx, larynx and major salivary glands. No cervical lymphadenopathy. Unremarkable thyroid gland. UPPER CHEST: No pneumothorax or pleural effusion. No nodules or masses. AORTIC ARCH: There is no calcific atherosclerosis of the aortic arch. Normal variant aortic arch branching pattern with the left vertebral artery arising independently from the aortic arch. RIGHT CAROTID SYSTEM: Normal without aneurysm, dissection or stenosis. LEFT CAROTID SYSTEM: Normal without aneurysm, dissection or stenosis. VERTEBRAL ARTERIES: Right dominant configuration. Calcific atherosclerosis at the right vertebral artery origin without hemodynamically significant stenosis. Remainder of the right vertebral artery is widely patent. Normal non dominant left vertebral artery. CTA HEAD FINDINGS POSTERIOR CIRCULATION: --Vertebral arteries: Normal V4 segments. --Inferior cerebellar arteries: Normal. --Basilar artery: Normal. --Superior cerebellar arteries: Normal. --Posterior cerebral arteries (PCA): Normal. ANTERIOR CIRCULATION: --Intracranial internal carotid arteries: Normal. --Anterior cerebral arteries (ACA): Normal. --Middle cerebral arteries (MCA): Normal. VENOUS SINUSES: As permitted by contrast timing, patent. ANATOMIC VARIANTS: None  Review of the MIP images confirms the above findings. IMPRESSION: 1. No emergent large vessel occlusion or hemodynamically significant stenosis of the head or neck. 2. Calcific atherosclerosis at the right vertebral artery origin without hemodynamically significant stenosis. Electronically Signed   By: Deatra Robinson M.D.   On: 01/26/2023 20:01   MR BRAIN WO CONTRAST  Result Date: 01/26/2023 CLINICAL DATA:  Acute neurologic deficit EXAM: MRI HEAD WITHOUT CONTRAST TECHNIQUE: Multiplanar, multiecho pulse sequences of the brain and surrounding structures were obtained without intravenous contrast. COMPARISON:  01/27/2020 FINDINGS: Brain: No acute infarct, mass effect or extra-axial collection. No acute or chronic hemorrhage. Normal white matter signal, parenchymal volume and CSF spaces. The midline structures are normal. 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 Vascular: Normal flow voids. Skull and upper cervical spine: Normal calvarium and skull base. Visualized upper cervical spine and soft tissues are normal. Sinuses/Orbits:No paranasal sinus fluid levels or advanced mucosal thickening. No mastoid or middle ear effusion. Normal orbits. IMPRESSION: 1. No acute intracranial abnormality. 2. 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 Electronically Signed   By: Deatra Robinson M.D.   On: 01/26/2023 19:07   CT HEAD CODE STROKE WO CONTRAST  Addendum Date: 01/26/2023   ADDENDUM REPORT: 01/26/2023 11:03 ADDENDUM: Study discussed by telephone with Dr. Jesusita Oka FLOYD on 01/26/2023 at 1049 hours. Electronically Signed   By: Odessa Fleming M.D.   On: 01/26/2023 11:03   Result Date: 01/26/2023 CLINICAL DATA:  Code stroke. 62 year old male right upper extremity deficit. EXAM: CT HEAD WITHOUT CONTRAST TECHNIQUE: Contiguous axial images were obtained from the base of the skull through the vertex without intravenous contrast. RADIATION DOSE REDUCTION: This exam was performed according to the departmental  dose-optimization program which includes automated exposure control, adjustment of the mA and/or kV according to patient size and/or use of iterative reconstruction technique. COMPARISON:  Brain MRI 01/27/2020. FINDINGS: Brain: Cerebral volume is within normal limits for age. No midline shift, ventriculomegaly, mass effect, evidence of mass lesion, intracranial hemorrhage or evidence  of cortically based acute infarction. Gray-white matter differentiation is within normal limits throughout the brain. Vascular: Calcified atherosclerosis at the skull base. No suspicious intracranial vascular hyperdensity. Skull: No acute osseous abnormality identified. Sinuses/Orbits: Minor maxillary sinus mucosal thickening on the right otherwise well aerated. Other: No gaze deviation. No acute orbit or scalp soft tissue finding. ASPECTS [facility] Stroke Program Early CT Score) Total score (0-10 with 10 being normal): 10 IMPRESSION: Normal for age noncontrast CT appearance of the brain.  ASPECTS 10. Electronically Signed: By: Odessa Fleming M.D. On: 01/26/2023 10:45    Micro Results     No results found for this or any previous visit (from the past 240 hour(s)).  Today   Subjective    Janene Harvey today has no headache,no chest abdominal pain,no new weakness tingling or numbness, feels much better wants to go home today.     Objective   Blood pressure 129/84, pulse 63, temperature 98.3 F (36.8 C), temperature source Oral, resp. rate 17, height 6\' 1"  (1.854 m), weight 89.5 kg, SpO2 94%.  No intake or output data in the 24 hours ending 01/28/23 0952  Exam  Awake Alert, No new F.N deficits,    [place].AT,PERRAL Supple Neck,   Symmetrical Chest wall movement, Good air movement bilaterally, CTAB RRR,No Gallops,   +ve B.Sounds, Abd Soft, Non tender,  No Cyanosis, Clubbing or edema    Data Review   Recent Labs  Lab 01/26/23 1035 01/27/23 0342  WBC 7.2 9.0  HGB 15.0 14.4  HCT 46.9 44.8  PLT 211 225  MCV 80.9 81.2   MCH 25.9* 26.1  MCHC 32.0 32.1  RDW 15.0 14.7  LYMPHSABS 1.7  --   MONOABS 0.6  --   EOSABS 0.4  --   BASOSABS 0.0  --     Recent Labs  Lab 01/26/23 1035 01/26/23 2035 01/27/23 0342  NA 138  --  138  K 4.6  --  3.7  CL 103  --  103  CO2 31  --  30  ANIONGAP 4*  --  5  GLUCOSE 125*  --  128*  BUN 18  --  16  CREATININE 1.06  --  1.07  AST 52*  --  47*  ALT 31  --  31  ALKPHOS 54  --  57  BILITOT 0.5  --  0.4  ALBUMIN 4.4  --  3.4*  INR 0.9  --   --   TSH  --   --  1.126  HGBA1C  --   --  5.6  MG  --  2.2  --   CALCIUM 9.7  --  9.1    Total Time in preparing paper work, data evaluation and todays exam - 35 minutes  Signature  -    Susa Raring M.D on 01/28/2023 at 9:52 AM   -  To page go to [email or web address]

## 2023-01-28 NOTE — Progress Notes (Signed)
   01/28/23 1011  TOC Brief Assessment  Insurance and Status Reviewed [employer] systems [employer])  Patient has primary care physician Yes (Burchette, Elberta Fortis, MD)  Home environment has been reviewed from home with Wife  Prior level of function: independent  Prior/Current Home Services No current home services  Social Determinants of Health Reivew SDOH reviewed no interventions necessary  Readmission risk has been reviewed Yes (N/A listed)  Transition of care needs no transition of care needs at this time

## 2023-01-29 ENCOUNTER — Telehealth: Payer: Self-pay

## 2023-01-29 NOTE — Transitions of Care (Post Inpatient/ED Visit) (Signed)
   01/29/2023  Name: DARIVS GOEDEKE MRN: [id number] DOB: Jul 15, 1960  Today's TOC FU Call Status: Today's TOC FU Call Status:: Successful TOC FU Call Completed TOC FU Call Complete Date: 01/29/23 Patient's Name and Date of Birth confirmed.  Transition Care Management Follow-up Telephone Call Date of Discharge: 01/28/23 Discharge Facility: [facility] Type of Discharge: Inpatient Admission How have you been since you were released from the hospital?: Better Any questions or concerns?: No  Items Reviewed: Did you receive and understand the discharge instructions provided?: Yes Medications obtained,verified, and reconciled?: Yes (Medications Reviewed) Any new allergies since your discharge?: No Dietary orders reviewed?: Yes Do you have support at home?: Yes People in Home: spouse  Medications Reviewed Today: Medications Reviewed Today     Reviewed by Karena Addison, LPN (Licensed Practical Nurse) on 01/29/23 at 1046  Med List Status: <None>   Medication Order Taking? Sig Documenting Provider Last Dose Status Informant  cyanocobalamin (VITAMIN B12) 1000 MCG/ML injection [id number] No Inject 1,000 mcg into the muscle every Friday. [provider] Past Week Active Self, Pharmacy Records  rosuvastatin (CRESTOR) 10 MG tablet [id number]  Take 1 tablet (10 mg total) by mouth daily. Leroy Sea, MD  Active             Home Care and Equipment/Supplies: Were Home Health Services Ordered?: NA Any new equipment or medical supplies ordered?: NA  Functional Questionnaire: Do you need assistance with bathing/showering or dressing?: No Do you need assistance with meal preparation?: No Do you need assistance with eating?: No Do you have difficulty maintaining continence: No Do you need assistance with getting out of bed/getting out of a chair/moving?: No Do you have difficulty managing or taking your medications?: No  Follow up appointments reviewed: PCP Follow-up  appointment confirmed?: Yes Date of PCP follow-up appointment?: 01/31/23 Follow-up Provider: [facility] Follow-up appointment confirmed?: NA Do you need transportation to your follow-up appointment?: No Do you understand care options if your condition(s) worsen?: Yes-patient verbalized understanding    SIGNATURE Karena Addison, LPN [facility] Nurse Health Advisor Direct Dial [phone]

## 2023-01-31 ENCOUNTER — Ambulatory Visit: Payer: No Typology Code available for payment source | Admitting: Family Medicine

## 2023-01-31 ENCOUNTER — Encounter: Payer: Self-pay | Admitting: Family Medicine

## 2023-01-31 VITALS — BP 160/84 | HR 76 | Temp 97.8°F | Ht 73.0 in | Wt 195.7 lb

## 2023-01-31 DIAGNOSIS — R55 Syncope and collapse: Secondary | ICD-10-CM

## 2023-01-31 DIAGNOSIS — E538 Deficiency of other specified B group vitamins: Secondary | ICD-10-CM | POA: Diagnosis not present

## 2023-01-31 DIAGNOSIS — G3184 Mild cognitive impairment, so stated: Secondary | ICD-10-CM

## 2023-01-31 LAB — HIV-1/2 AB - DIFFERENTIATION
HIV 1 Ab: NONREACTIVE
HIV 2 Ab: NONREACTIVE
Note: NEGATIVE

## 2023-01-31 LAB — HIV-1/HIV-2 QUALITATIVE RNA
Final Interpretation: NEGATIVE
HIV-1 RNA, Qualitative: NONREACTIVE
HIV-2 RNA, Qualitative: NONREACTIVE

## 2023-01-31 NOTE — Progress Notes (Signed)
Established Patient Office Visit  Subjective   Patient ID: Paul Bird, male    DOB: 09/24/1960  Age: 62 y.o. MRN: [id number]  Chief Complaint  Patient presents with   Hospitalization Follow-up    HPI   Paul Bird is seen for hospital follow-up following recent episode of altered consciousness.  He is generally very health-conscious and exercises regularly.  He was admitted October 27 and discharged October 29 following episode of altered consciousness  He was doing well until day of admission when he was sitting down and felt unwell and became diaphoretic.  His admission note states that he was unconscious for 2 to 3 minutes but patient disputes this.  He states he never fully lost consciousness.  In any event, he certainly had altered consciousness and there was also report of his eyes "rolling back "prior to altered consciousness.  When he came back around he complained of some right-sided tingling and possible right visual field changes.  Was brought into the ER at that point for further evaluation  Seen by neurology.  He had CT angiogram head and neck which were normal.  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  He does not recall any prior focal neurologic deficits.  EEG unremarkable.  He also had some bradycardia and was seen by cardiology/EP.  Electrolytes stable.  Echocardiogram unremarkable.  CT coronary morphology study showed coronary calcium score of 1.37 with total plaque volume of 27 which is 10th percentile for age and gender.  Minimal nonobstructive CAD Patient has scheduled follow-up with cardiology little over a month.  Plan was for implantation of loop recorder.  He had no episodes since his admission.  He is back to exercising regularly.  Patient recalls taking over-the-counter hemp product called "HIMS" with a couple doses prior to his symptoms and he wonders if that may have contributed.  His lab work was largely  unremarkable.  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  Blood pressure up initially today but he states he just came from exercise and had a large coffee.  Home blood pressures have consistently been around 120/70.  Past Medical History:  Diagnosis Date   Actinic keratosis 04/19/2009   Adenomatous polyp    ADHD (attention deficit hyperactivity disorder) 10/12/2013   Dysesthesia 09/08/2009   Erectile dysfunction 04/19/2009   Qualifier: Diagnosis of   By: Rita Ohara       Melanoma of skin 11/19/2017   Clark's stage I, depth 0.2 mm   Mild cognitive impairment of uncertain or unknown etiology 04/19/2022   Vitamin B12 deficiency    Past Surgical History:  Procedure Laterality Date   COLONOSCOPY     melanoma removal  2019   neck and chest    NECK SURGERY     POLYPECTOMY     ROTATOR CUFF REPAIR  2001   right    reports that he has never smoked. He has never used smokeless tobacco. He reports current alcohol use. He reports that he does not use drugs. family history includes Dementia in his mother; Heart disease (age of onset: 7) in his father; Memory loss in his mother. No Known Allergies  Review of Systems  Constitutional:  Negative for chills, fever and malaise/fatigue.  Eyes:  Negative for blurred vision.  Respiratory:  Negative for shortness of breath.   Cardiovascular:  Negative for chest pain.  Gastrointestinal:  Negative for abdominal pain.  Neurological:  Negative for dizziness,  speech change, focal weakness, weakness and headaches.      Objective:     BP (!) 160/84 (BP Location: Left Arm, Cuff Size: Normal)   Pulse 76   Temp 97.8 F (36.6 C) (Oral)   Ht 6\' 1"  (1.854 m)   Wt 195 lb 11.2 oz (88.8 kg)   SpO2 99%   BMI 25.82 kg/m  BP Readings from Last 3 Encounters:  01/31/23 (!) 160/84  01/28/23 129/84  04/30/22 (!) 140/78   Wt Readings from Last 3 Encounters:  01/31/23 195 lb 11.2 oz (88.8 kg)  01/26/23 197 lb 5 oz (89.5  kg)  04/30/22 195 lb 14.4 oz (88.9 kg)      Physical Exam Vitals reviewed.  Constitutional:      General: He is not in acute distress.    Appearance: He is well-developed. He is not ill-appearing.  HENT:     Head: Normocephalic and atraumatic.  Eyes:     Extraocular Movements: Extraocular movements intact.     Pupils: Pupils are equal, round, and reactive to light.  Neck:     Thyroid: No thyromegaly.  Cardiovascular:     Rate and Rhythm: Normal rate and regular rhythm.     Heart sounds: No murmur heard.    No gallop.  Pulmonary:     Effort: Pulmonary effort is normal. No respiratory distress.     Breath sounds: Normal breath sounds. No wheezing or rales.  Musculoskeletal:     Cervical back: Neck supple.     Right lower leg: No edema.     Left lower leg: No edema.  Neurological:     Mental Status: He is alert and oriented to person, place, and time.     Cranial Nerves: No cranial nerve deficit.     Motor: No weakness.     Gait: Gait normal.  Psychiatric:        Mood and Affect: Mood normal.        Thought Content: Thought content normal.      No results found for any visits on 01/31/23.  Last CBC Lab Results  Component Value Date   WBC 9.0 01/27/2023   HGB 14.4 01/27/2023   HCT 44.8 01/27/2023   MCV 81.2 01/27/2023   MCH 26.1 01/27/2023   RDW 14.7 01/27/2023   PLT 225 01/27/2023   Last metabolic panel Lab Results  Component Value Date   GLUCOSE 128 (H) 01/27/2023   NA 138 01/27/2023   K 3.7 01/27/2023   CL 103 01/27/2023   CO2 30 01/27/2023   BUN 16 01/27/2023   CREATININE 1.07 01/27/2023   GFRNONAA >60 01/27/2023   CALCIUM 9.1 01/27/2023   PROT 6.9 01/27/2023   ALBUMIN 3.4 (L) 01/27/2023   BILITOT 0.4 01/27/2023   ALKPHOS 57 01/27/2023   AST 47 (H) 01/27/2023   ALT 31 01/27/2023   ANIONGAP 5 01/27/2023   Last lipids Lab Results  Component Value Date   CHOL 188 01/27/2023   HDL 52 01/27/2023   LDLCALC 116 (H) 01/27/2023   LDLDIRECT 154.3  04/19/2009   TRIG 99 01/27/2023   CHOLHDL 3.6 01/27/2023   Last hemoglobin A1c Lab Results  Component Value Date   HGBA1C 5.6 01/27/2023   Last thyroid functions Lab Results  Component Value Date   TSH 1.126 01/27/2023   Last vitamin B12 and Folate Lab Results  Component Value Date   VITAMINB12 2,434 (H) 01/27/2023      The 10-year ASCVD risk score (Arnett DK, et  al., 2019) is: 12.5%    Assessment & Plan:   Patient seen with recent episode of altered consciousness of uncertain etiology.  It was felt this was most likely vasovagal.  No evidence for seizure activity.  Evaluated by cardiology with CT morphology study and echo which were mostly unremarkable.  He has felt well since discharge with no further symptoms.  -Recommend to keep follow-up with cardiology.  They have discussed possible loop recorder -Patient was discharged on statin but declines taking this.  Recent coronary calcium score 1.37 with minimal nonobstructive CAD by imaging above -Follow-up immediately for any recurrent dizziness or syncope. -Blood pressure was up a bit today but patient states this has been consistently well-controlled at home and he feels somewhat anxious today.  Monitor closely and be in touch if consistently greater than 140/90 -History of B12 deficiency.  Recent B12 level over 2000.  Back off on supplementation.  Evelena Peat, MD

## 2023-02-10 ENCOUNTER — Encounter: Payer: Self-pay | Admitting: Neurology

## 2023-02-24 ENCOUNTER — Encounter: Payer: Self-pay | Admitting: Internal Medicine

## 2023-02-26 ENCOUNTER — Encounter: Payer: Self-pay | Admitting: Internal Medicine

## 2023-02-26 ENCOUNTER — Encounter: Payer: Self-pay | Admitting: Family Medicine

## 2023-02-26 ENCOUNTER — Ambulatory Visit (INDEPENDENT_AMBULATORY_CARE_PROVIDER_SITE_OTHER): Payer: No Typology Code available for payment source | Admitting: Family Medicine

## 2023-02-26 VITALS — BP 120/70 | HR 65 | Temp 98.0°F | Ht 72.05 in | Wt 191.4 lb

## 2023-02-26 DIAGNOSIS — D126 Benign neoplasm of colon, unspecified: Secondary | ICD-10-CM

## 2023-02-26 DIAGNOSIS — Z0000 Encounter for general adult medical examination without abnormal findings: Secondary | ICD-10-CM

## 2023-02-26 LAB — CBC WITH DIFFERENTIAL/PLATELET
Basophils Absolute: 0 10*3/uL (ref 0.0–0.1)
Basophils Relative: 0.5 % (ref 0.0–3.0)
Eosinophils Absolute: 0.2 10*3/uL (ref 0.0–0.7)
Eosinophils Relative: 2.8 % (ref 0.0–5.0)
HCT: 43.6 % (ref 39.0–52.0)
Hemoglobin: 14.2 g/dL (ref 13.0–17.0)
Lymphocytes Relative: 28.5 % (ref 12.0–46.0)
Lymphs Abs: 2 10*3/uL (ref 0.7–4.0)
MCHC: 32.6 g/dL (ref 30.0–36.0)
MCV: 82.1 fL (ref 78.0–100.0)
Monocytes Absolute: 0.6 10*3/uL (ref 0.1–1.0)
Monocytes Relative: 8.2 % (ref 3.0–12.0)
Neutro Abs: 4.3 10*3/uL (ref 1.4–7.7)
Neutrophils Relative %: 60 % (ref 43.0–77.0)
Platelets: 227 10*3/uL (ref 150.0–400.0)
RBC: 5.31 Mil/uL (ref 4.22–5.81)
RDW: 15.2 % (ref 11.5–15.5)
WBC: 7.2 10*3/uL (ref 4.0–10.5)

## 2023-02-26 LAB — COMPREHENSIVE METABOLIC PANEL
ALT: 23 U/L (ref 0–53)
AST: 26 U/L (ref 0–37)
Albumin: 4.3 g/dL (ref 3.5–5.2)
Alkaline Phosphatase: 68 U/L (ref 39–117)
BUN: 20 mg/dL (ref 6–23)
CO2: 30 meq/L (ref 19–32)
Calcium: 9.6 mg/dL (ref 8.4–10.5)
Chloride: 104 meq/L (ref 96–112)
Creatinine, Ser: 1.04 mg/dL (ref 0.40–1.50)
GFR: 77.2 mL/min (ref >60.00)
Glucose, Bld: 92 mg/dL (ref 70–99)
Potassium: 4.9 meq/L (ref 3.5–5.1)
Sodium: 140 meq/L (ref 135–145)
Total Bilirubin: 0.6 mg/dL (ref 0.2–1.2)
Total Protein: 7.1 g/dL (ref 6.0–8.3)

## 2023-02-26 LAB — LIPID PANEL
Cholesterol: 183 mg/dL (ref 0–200)
HDL: 53.1 mg/dL (ref >39.00)
LDL Cholesterol: 119 mg/dL — ABNORMAL HIGH (ref 0–99)
NonHDL: 130.34
Total CHOL/HDL Ratio: 3
Triglycerides: 56 mg/dL (ref 0.0–149.0)
VLDL: 11.2 mg/dL (ref 0.0–40.0)

## 2023-02-26 LAB — PSA: PSA: 2.23 ng/mL (ref 0.10–4.00)

## 2023-02-26 NOTE — Progress Notes (Signed)
Established Patient Office Visit  Subjective   Patient ID: Paul Bird, male    DOB: Oct 26, 1960  Age: 62 y.o. MRN: [id number]  No chief complaint on file.   HPI   Paul Bird is seen today for physical exam..  Recent episode of syncope as per prior note.  He has had no dizziness or syncope whatsoever since then.  Was felt to have probably vasovagal syncope.  CT coronary morphology study and echo essentially unremarkable.  Generally doing well.  Very health-conscious.  Exercises regularly.  Participates in exercises Men's program called [employer].    Does have history of melanoma and is followed regularly by dermatologist.  Health maintenance reviewed:  Health Maintenance  Topic Date Due   COVID-19 Vaccine (1) Never done   Colonoscopy  03/06/2023   Zoster Vaccines- Shingrix (1 of 2) 05/29/2023 (Originally 02/06/1980)   INFLUENZA VACCINE  06/30/2023 (Originally 10/31/2022)   DTaP/Tdap/Td (2 - Td or Tdap) 02/26/2024 (Originally 12/29/2020)   Hepatitis C Screening  Completed   HIV Screening  Completed   HPV VACCINES  Aged Out   -Is due for follow-up colonoscopy and consents to getting this set up. -Declines influenza vaccine, Shingrix, and Tdap  Social history-married.  No history of smoking.  No alcohol.  Works for [employer] which is a company that [employer]  Family history.  Mom died of Alzheimer's disease complications age 44.  His father had heart transplant apparently sometime in his 94s with indication unclear.  He may have had severe myocarditis.  He apparently died the day after transplant  Past Medical History:  Diagnosis Date   Actinic keratosis 04/19/2009   Adenomatous polyp    ADHD (attention deficit hyperactivity disorder) 10/12/2013   Dysesthesia 09/08/2009   Erectile dysfunction 04/19/2009   Qualifier: Diagnosis of   By: Rita Ohara       Melanoma of skin 11/19/2017   Clark's stage I, depth 0.2 mm   Mild cognitive impairment of  uncertain or unknown etiology 04/19/2022   Vitamin B12 deficiency    Past Surgical History:  Procedure Laterality Date   COLONOSCOPY     melanoma removal  2019   neck and chest    NECK SURGERY     POLYPECTOMY     ROTATOR CUFF REPAIR  2001   right    reports that he has never smoked. He has never used smokeless tobacco. He reports current alcohol use. He reports that he does not use drugs. family history includes Dementia in his mother; Heart disease (age of onset: 5) in his father; Memory loss in his mother. No Known Allergies   Review of Systems  Constitutional:  Negative for chills, fever, malaise/fatigue and weight loss.  HENT:  Negative for hearing loss.   Eyes:  Negative for blurred vision and double vision.  Respiratory:  Negative for cough and shortness of breath.   Cardiovascular:  Negative for chest pain, palpitations and leg swelling.  Gastrointestinal:  Negative for abdominal pain, blood in stool, constipation and diarrhea.  Genitourinary:  Negative for dysuria.  Skin:  Negative for rash.  Neurological:  Negative for dizziness, speech change, seizures and headaches.  Psychiatric/Behavioral:  Negative for depression.       Objective:     BP 120/70 (BP Location: Left Arm, Patient Position: Sitting, Cuff Size: Normal)   Pulse 65   Temp 98 F (36.7 C) (Oral)   Ht 6' 0.05" (1.83 m)   Wt 191 lb 6.4 oz (86.8  kg)   SpO2 98%   BMI 25.92 kg/m  BP Readings from Last 3 Encounters:  02/26/23 120/70  01/31/23 (!) 160/84  01/28/23 129/84   Wt Readings from Last 3 Encounters:  02/26/23 191 lb 6.4 oz (86.8 kg)  01/31/23 195 lb 11.2 oz (88.8 kg)  01/26/23 197 lb 5 oz (89.5 kg)      Physical Exam Vitals reviewed.  Constitutional:      General: He is not in acute distress.    Appearance: He is well-developed.  HENT:     Head: Normocephalic and atraumatic.     Right Ear: External ear normal.     Left Ear: External ear normal.  Eyes:     Conjunctiva/sclera:  Conjunctivae normal.     Pupils: Pupils are equal, round, and reactive to light.  Neck:     Thyroid: No thyromegaly.  Cardiovascular:     Rate and Rhythm: Normal rate and regular rhythm.     Heart sounds: Normal heart sounds. No murmur heard. Pulmonary:     Effort: No respiratory distress.     Breath sounds: No wheezing or rales.  Abdominal:     General: Bowel sounds are normal. There is no distension.     Palpations: Abdomen is soft. There is no mass.     Tenderness: There is no abdominal tenderness. There is no guarding or rebound.  Musculoskeletal:     Cervical back: Normal range of motion and neck supple.     Right lower leg: No edema.     Left lower leg: No edema.  Lymphadenopathy:     Cervical: No cervical adenopathy.  Skin:    Findings: No rash.  Neurological:     Mental Status: He is alert and oriented to person, place, and time.     Cranial Nerves: No cranial nerve deficit.     Deep Tendon Reflexes: Reflexes normal.      No results found for any visits on 02/26/23.    The 10-year ASCVD risk score (Arnett DK, et al., 2019) is: 8.4%    Assessment & Plan:   Problem List Items Addressed This Visit   None Visit Diagnoses     Physical exam    -  Primary   Relevant Orders   CBC with Differential/Platelet   Lipid panel   CMP   PSA     62 year old male with history of adenomatous colon polyp, past history of melanoma, ADD, mild cognitive impairment, and B12 deficiency.  Recent presumed vasovagal syncope.  No recurrence since then.  Cardiac evaluation unrevealing.  He declined further cardiac assessment.  There have been some discussion of loop recorder.  -Obtain follow-up labs.  He had recent mildly elevated AST of uncertain significance but does not use any alcohol at this time.  Recheck a CMP lipid and PSA -Set up repeat colonoscopy -Discussed vaccines including influenza and Shingrix and he declines  No follow-ups on file.    Evelena Peat, MD

## 2023-03-12 ENCOUNTER — Ambulatory Visit: Payer: No Typology Code available for payment source | Attending: Internal Medicine | Admitting: Internal Medicine

## 2023-03-12 NOTE — Progress Notes (Unsigned)
ELECTROPHYSIOLOGY OFFICE NOTE  Patient ID: Paul Bird, MRN: [id number], DOB/AGE: Jul 13, 1960 62 y.o. Admit date: (Not on file) Date of Consult: 03/12/2023  Primary Physician: Kristian Covey, MD    HPI Paul Bird is a 62 y.o. male seen following hospital stay 10/24 following syncopal episode thought to be vasovagal clinically supportive  Eval 1024  CTA angio head and neack normal, MRI ? Old L cerebellar infarct EEGneg Tele>> PP prolongation followed by AV block consistent w hypervagatonia; UDS >>THC and EtOH  Also atrial tach ? Triggering  Hx also noted for neurocognitive issues and has follow-up scheduled with  DATE TEST EF   10/24 Echo   55-60 %   10/24 CTA    % Nonobstructive CAD         Date Cr K Hgb  11/24 1.04 4.9 14.2            Past Medical History:  Diagnosis Date   Actinic keratosis 04/19/2009   Adenomatous polyp    ADHD (attention deficit hyperactivity disorder) 10/12/2013   Dysesthesia 09/08/2009   Erectile dysfunction 04/19/2009   Qualifier: Diagnosis of   By: Rita Ohara       Melanoma of skin 11/19/2017   Clark's stage I, depth 0.2 mm   Mild cognitive impairment of uncertain or unknown etiology 04/19/2022   Vitamin B12 deficiency       Surgical History:  Past Surgical History:  Procedure Laterality Date   COLONOSCOPY     melanoma removal  2019   neck and chest    NECK SURGERY     POLYPECTOMY     ROTATOR CUFF REPAIR  2001   right     Home Meds: No outpatient medications have been marked as taking for the 03/12/23 encounter (Appointment) with Duke Salvia, MD.    Allergies: No Known Allergies  Social History   Socioeconomic History   Marital status: Married    Spouse name: Not on file   Number of children: Not on file   Years of education: 12   Highest education level: High school graduate  Occupational History   Occupation: Customer Relations  Tobacco Use   Smoking status: Never   Smokeless tobacco:  Never  Substance and Sexual Activity   Alcohol use: Yes    Comment: once every 1-2 weeks   Drug use: No   Sexual activity: Not on file  Other Topics Concern   Not on file  Social History Narrative   Not on file   Social Determinants of Health   Financial Resource Strain: Low Risk  (04/30/2022)   Overall Financial Resource Strain (CARDIA)    Difficulty of Paying Living Expenses: Not very hard  Food Insecurity: No Food Insecurity (01/26/2023)   Hunger Vital Sign    Worried About Running Out of Food in the Last Year: Never true    Ran Out of Food in the Last Year: Never true  Transportation Needs: No Transportation Needs (01/26/2023)   PRAPARE - Administrator, Civil Service (Medical): No    Lack of Transportation (Non-Medical): No  Physical Activity: Sufficiently Active (04/30/2022)   Exercise Vital Sign    Days of Exercise per Week: 5 days    Minutes of Exercise per Session: 50 min  Stress: No Stress Concern Present (04/30/2022)   [employer] of Occupational Health - Occupational Stress Questionnaire    Feeling of Stress : Not at all  Social Connections: Unknown (04/30/2022)  Social Advertising account executive [NHANES]    Frequency of Communication with Friends and Family: More than three times a week    Frequency of Social Gatherings with Friends and Family: More than three times a week    Attends Religious Services: Patient declined    Database administrator or Organizations: Yes    Attends Banker Meetings: Patient declined    Marital Status: Married  Catering manager Violence: Not At Risk (01/26/2023)   Humiliation, Afraid, Rape, and Kick questionnaire    Fear of Current or Ex-Partner: No    Emotionally Abused: No    Physically Abused: No    Sexually Abused: No     Family History  Problem Relation Age of Onset   Dementia Mother        31s   Memory loss Mother    Heart disease Father 97       ?viral myocarditis   Colon cancer Neg  Hx    Colon polyps Neg Hx    Rectal cancer Neg Hx    Ulcerative colitis Neg Hx    Stomach cancer Neg Hx    Esophageal cancer Neg Hx      ROS:  Please see the history of present illness.   {ros master:[id number]}  All other systems reviewed and negative.    Physical Exam:*** There were no vitals taken for this visit. General: Well developed, well nourished male in no acute distress. Head: Normocephalic, atraumatic, sclera non-icteric, no xanthomas, nares are without discharge. EENT: normal  Lymph Nodes:  none Neck: Negative for carotid bruits. JVD not elevated. Back:without scoliosis kyphosis*** Lungs: Clear bilaterally to auscultation without wheezes, rales, or rhonchi. Breathing is unlabored. Heart: RRR with S1 S2. No *** ***/6 systolic*** murmur . No rubs, or gallops appreciated. Abdomen: Soft, non-tender, non-distended with normoactive bowel sounds. No hepatomegaly. No rebound/guarding. No obvious abdominal masses. Msk:  Strength and tone appear normal for age. Extremities: No clubbing or cyanosis. No*** ***+*** edema.  Distal pedal pulses are 2+ and equal bilaterally. Skin: Warm and Dry Neuro: Alert and oriented X 3. CN III-XII intact Grossly normal sensory and motor function . Psych:  Responds to questions appropriately with a normal affect.        EKG: ***   Assessment and Plan: *** Sherryl Manges

## 2023-03-13 ENCOUNTER — Encounter: Payer: Self-pay | Admitting: Internal Medicine

## 2023-03-19 ENCOUNTER — Ambulatory Visit (INDEPENDENT_AMBULATORY_CARE_PROVIDER_SITE_OTHER): Payer: No Typology Code available for payment source | Admitting: Neurology

## 2023-03-19 ENCOUNTER — Encounter: Payer: Self-pay | Admitting: Neurology

## 2023-03-19 VITALS — BP 146/83 | HR 66 | Ht 72.0 in | Wt 194.6 lb

## 2023-03-19 DIAGNOSIS — R404 Transient alteration of awareness: Secondary | ICD-10-CM

## 2023-03-19 DIAGNOSIS — G3184 Mild cognitive impairment, so stated: Secondary | ICD-10-CM | POA: Diagnosis not present

## 2023-03-19 NOTE — Patient Instructions (Addendum)
Good to meet you.  Schedule 24-hour EEG  2. Start daily aspirin 81mg   3. Continue to monitor symptoms  4. It is prudent to recommend that all persons should be free of syncopal (loss of consciousness) episodes for at least six months to be granted the driving privilege." (THE [employer] PHYSICIAN'S GUIDE TO DRIVER MEDICAL EVALUATION, Second Edition, Medical Review Branch, Associate Professor, Division of [employer] of Transportation, July 2004)  5. Follow-up in 3 months, call for any changes

## 2023-03-19 NOTE — Progress Notes (Unsigned)
NEUROLOGY CONSULTATION NOTE  DEBORA SHIRAH MRN: [id number] DOB: 1960-11-24  Referring provider: Dr. Marvel Plan Primary care provider: Dr. Evelena Peat  Reason for consult:  syncope  Dear Dr Roda Shutters:  Thank you for your kind referral of Terrance Mass for consultation of the above symptoms. Although his history is well known to you, please allow me to reiterate it for the purpose of our medical record. The patient was accompanied to the clinic by his wife who also provides collateral information. Records and images were personally reviewed where available.   HISTORY OF PRESENT ILLNESS: ***.  LH Ambi Always been active, exercises regularly At homes and felt in his chest, like an anxiety attack, swelling in chest, felt dizzy, turned gray and sweaty, could talk but having a little problem getting words out (mumbling), refusing to go to ER, does not remember having focal sx, no HA,  Does not remember much of the ER  Felt that chest pressure when he was about to exercise, couple weeks ago, HR on watch stayed the same; no nausea, no halluc, 1-2 mins max Denies any staring, memory after that episode felt diff as to directions of different places Process so much during day, forgets Sz sx: - ROS: no HAs, standing up quickly feels dizzy not all the time;  RF: -; father had heart transplant;  Mother had dementia Took the day before cialis Sleep 6-7hrs No palptitaions No trmores, no anosmia  Wife: [name] Sitting at table, head went back, right arm went up, eyes rolled a litle bit, kind of mumbling, asked if he was okay, speech was kind of mumbu jumbo, stood up and could tell balance was off, she does not think he knew who she was, put him on recliner and put his feet up, pale, sweaty, told we have to go to er, then several mins he kind of came out of it said he was fine; lay on bed, able to communicate but still very pale and sweaty; sitting; no tongube bite or incont On the recliner, he  kept opening and closing both hands and mouth, like trying to get feeling in hands and mouth; did not notice any weakness No words, sounds ER: sitting in room talking then suddenly same look came over his face, alerted nurse frew mins; could see in his eyes and face, HR 27, pale and sweaty No more of the face looking like that, more fatigued, feels like things are in slow motion, mor eprocess to remember something, like his cloud is full Memory is worse No staring, but when talking just a slower thought process to answer; gets frustrated when he can't answer Memory issues for 3 years now, mri without contrast 2021 normal +1 Dlrow, 3/3     10/27:melanoma, mild cognitive impairment and B12 deficiency who presented after acute onset of multiple neurologic symptoms.  Last known well was 10:15 he was sitting at the breakfast table having a conversation with his wife when his eyes rolled back in his head and he stopped speaking.  He was poorly responsive for about 10 minutes and then when he tried to get up he almost fell.  At that time he was noted to have right sided weakness and numbness however this has improved significantly and he now exhibits only right arm tingling.  He also has a visual field deficit in the right upper quadrant of his right eye only (unclear chronicity).  NIH stroke scale was 3.    took Cialis the night  before (the second time use it, first time he felt wired too). In the morning, he was talking with his wife at the breakfast table, suddenly he leaning backwards, his eyes rolling back, slurry speech, garbled speech, pale, lack of responsiveness. He was trying to get up to walk but very wobbly, so wife got him into recliner. He was still more blank stares, feeling b/l hand cold clammy and tingling, diaphoresis. After 10-39min, he was much better and got back to lay down in his bed.   ED, he also had an episode of difficulty breathing, chest tightness, panic looking, wife called RN  and found his HR was 27 on tele. Episodes lasted about 1-2 min resolved. Pt stated that he has some episodes like this at home, more so when gets to bed at night and one time was in the kitchen lately.    He has hx of cognitive impairment. Saw neuropsych and had neuropsych testing which showed cognitive impairment but also found to have B12 deficiency. After B12 supplement, pt felt better cognition but wife did not see significant improvement.  recalls taking over-the-counter hemp product called "HIMS" with a couple doses prior to his symptoms and he wonders if that may have contributed.   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  Epilepsy Risk Factors:  *** had a normal birth and early development.  There is no history of febrile convulsions, CNS infections such as meningitis/encephalitis, significant traumatic brain injury, neurosurgical procedures, or family history of seizures.  Prior AEDs: Laboratory Data:  EEGs: MRI:   PAST MEDICAL HISTORY: Past Medical History:  Diagnosis Date   Actinic keratosis 04/19/2009   Adenomatous polyp    ADHD (attention deficit hyperactivity disorder) 10/12/2013   Dysesthesia 09/08/2009   Erectile dysfunction 04/19/2009   Qualifier: Diagnosis of   By: Rita Ohara       Melanoma of skin 11/19/2017   Clark's stage I, depth 0.2 mm   Mild cognitive impairment of uncertain or unknown etiology 04/19/2022   Vitamin B12 deficiency     PAST SURGICAL HISTORY: Past Surgical History:  Procedure Laterality Date   COLONOSCOPY     melanoma removal  2019   neck and chest    NECK SURGERY     POLYPECTOMY     ROTATOR CUFF REPAIR  2001   right    MEDICATIONS: No current outpatient medications on file prior to visit.   No current facility-administered medications  on file prior to visit.    ALLERGIES: No Known Allergies  FAMILY HISTORY: Family History  Problem Relation Age of Onset   Dementia Mother        77s   Memory loss Mother    Heart disease Father 21       ?viral myocarditis   Colon cancer Neg Hx    Colon polyps Neg Hx    Rectal cancer Neg Hx    Ulcerative colitis Neg Hx    Stomach cancer Neg Hx    Esophageal cancer Neg Hx     SOCIAL HISTORY: Social History   Socioeconomic History   Marital status: Married    Spouse name: Not on file   Number of children: Not on file   Years of education: 12   Highest education level: High school graduate  Occupational History   Occupation: Customer Relations  Tobacco Use   Smoking status:  Never   Smokeless tobacco: Never  Vaping Use   Vaping status: Never Used  Substance and Sexual Activity   Alcohol use: Yes    Comment: once every 1-2 weeks   Drug use: No   Sexual activity: Not on file  Other Topics Concern   Not on file  Social History Narrative   Are you right handed or left handed? Left handed   Are you currently employed ? 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 No    Who lives with you? Wife    What type of home do you live in: 1 story or 2 story? 1 story with basement        Social Drivers of Health   Financial Resource Strain: Low Risk  (04/30/2022)   Overall Financial Resource Strain (CARDIA)    Difficulty of Paying Living Expenses: Not very hard  Food Insecurity: No Food Insecurity (01/26/2023)   Hunger Vital Sign    Worried About Running Out of Food in the Last Year: Never true    Ran Out of Food in the Last Year: Never true  Transportation Needs: No Transportation Needs (01/26/2023)   PRAPARE - Administrator, Civil Service (Medical): No    Lack of Transportation (Non-Medical): No  Physical Activity: Sufficiently Active (04/30/2022)   Exercise Vital Sign    Days of Exercise per Week: 5 days     Minutes of Exercise per Session: 50 min  Stress: No Stress Concern Present (04/30/2022)   [employer] of Occupational Health - Occupational Stress Questionnaire    Feeling of Stress : Not at all  Social Connections: Unknown (04/30/2022)   Social Connection and Isolation Panel [NHANES]    Frequency of Communication with Friends and Family: More than three times a week    Frequency of Social Gatherings with Friends and Family: More than three times a week    Attends Religious Services: Patient declined    Database administrator or Organizations: Yes    Attends Banker Meetings: Patient declined    Marital Status: Married  Catering manager Violence: Not At Risk (01/26/2023)   Humiliation, Afraid, Rape, and Kick questionnaire    Fear of Current or Ex-Partner: No    Emotionally Abused: No    Physically Abused: No    Sexually Abused: No     PHYSICAL EXAM: Vitals:   03/19/23 0846  BP: (!) 146/83  Pulse: 66  SpO2: 100%   General: No acute distress Head:  Normocephalic/atraumatic Skin/Extremities: No rash, no edema Neurological Exam: Mental status: alert and oriented to person, place, and time, no dysarthria or aphasia, Fund of knowledge is appropriate.  Recent and remote memory are intact.  Attention and concentration are normal.    Able to name objects and repeat phrases. Cranial nerves: CN I: not tested CN II: pupils equal, round and reactive to light, visual fields intact CN III, IV, VI:  full range of motion, no nystagmus, no ptosis CN V: facial sensation intact CN VII: upper and lower face symmetric CN VIII: hearing intact to conversation Bulk & Tone: normal, no fasciculations. Motor: 5/5 throughout with no pronator drift. Sensation: intact to light touch, cold, pin, vibration and joint position sense.  No extinction to double simultaneous stimulation.  Romberg test *** Deep Tendon Reflexes: +2 throughout, no ankle clonus Plantar responses: downgoing  bilaterally Cerebellar: no incoordination on finger to nose, heel to shin.  No dysdiadochokinesia Gait: narrow-based and steady, able to tandem walk adequately. Tremor: ***   IMPRESSION: This is a *** year old ***-handed *** with a history of ***.   driving laws were discussed with the patient, and *** knows to stop driving after a seizure, until 6 months seizure-free.    The duration of this appointment visit was *** minutes of face-to-face time with the patient.  Greater than 50% of this time was spent in counseling, explanation of diagnosis, planning of further management, and coordination of care.  Thank you for allowing me to participate in the care of this patient. Please do not hesitate to call for any questions or concerns.   Patrcia Dolly, M.D.  CC: ***

## 2023-03-27 ENCOUNTER — Ambulatory Visit: Payer: No Typology Code available for payment source | Admitting: Neurology

## 2023-03-31 ENCOUNTER — Telehealth: Payer: Self-pay | Admitting: *Deleted

## 2023-04-08 ENCOUNTER — Encounter: Payer: Self-pay | Admitting: Family Medicine

## 2023-04-08 ENCOUNTER — Ambulatory Visit: Payer: No Typology Code available for payment source | Admitting: Family Medicine

## 2023-04-08 VITALS — BP 130/66 | HR 66 | Temp 97.6°F | Wt 195.4 lb

## 2023-04-08 DIAGNOSIS — F909 Attention-deficit hyperactivity disorder, unspecified type: Secondary | ICD-10-CM | POA: Diagnosis not present

## 2023-04-08 MED ORDER — AMPHETAMINE-DEXTROAMPHETAMINE 10 MG PO TABS: 10.0000 mg | ORAL_TABLET | Freq: Two times a day (BID) | ORAL | 0 refills | Status: DC

## 2023-04-08 NOTE — Progress Notes (Signed)
 Established Patient Office Visit  Subjective   Patient ID: Paul Bird, male    DOB: July 26, 1960  Age: 63 y.o. MRN: [id number]  Chief Complaint  Patient presents with   Medication Consultation    HPI   Paul Bird is seen for follow-up regarding attention deficit disorder.  He has seen clinical psychologist previously and diagnosed with ADD.  For some time he took Vyvanse  but states he had emotional letdown at the end of the day and felt poorly on the Vyvanse .  His daughter takes Adderall and he took 1 dose of immediate release and felt much better.  He does have history of mild cognitive impairment.  Also has history of B12 deficiency now on replacement.  He felt much more focused on taking Adderall.  No heart history.  Very health-conscious and exercises regularly.  Past Medical History:  Diagnosis Date   Actinic keratosis 04/19/2009   Adenomatous polyp    ADHD (attention deficit hyperactivity disorder) 10/12/2013   Dysesthesia 09/08/2009   Erectile dysfunction 04/19/2009   Qualifier: Diagnosis of   By: Arbutus Noon       Melanoma of skin 11/19/2017   Clark's stage I, depth 0.2 mm   Mild cognitive impairment of uncertain or unknown etiology 04/19/2022   Vitamin B12 deficiency    Past Surgical History:  Procedure Laterality Date   COLONOSCOPY     melanoma removal  2019   neck and chest    NECK SURGERY     POLYPECTOMY     ROTATOR CUFF REPAIR  2001   right    reports that he has never smoked. He has never used smokeless tobacco. He reports current alcohol use. He reports that he does not use drugs. family history includes Dementia in his mother; Heart disease (age of onset: 94) in his father; Memory loss in his mother. No Known Allergies  Review of Systems  Constitutional:  Negative for malaise/fatigue.  Eyes:  Negative for blurred vision.  Respiratory:  Negative for shortness of breath.   Cardiovascular:  Negative for chest pain.  Neurological:  Negative for  dizziness, weakness and headaches.      Objective:     BP 130/66 (BP Location: Left Arm, Patient Position: Sitting, Cuff Size: Normal)   Pulse 66   Temp 97.6 F (36.4 C) (Oral)   Wt 195 lb 6.4 oz (88.6 kg)   SpO2 97%   BMI 26.50 kg/m  BP Readings from Last 3 Encounters:  04/08/23 130/66  03/19/23 (!) 146/83  02/26/23 120/70   Wt Readings from Last 3 Encounters:  04/08/23 195 lb 6.4 oz (88.6 kg)  03/19/23 194 lb 9.6 oz (88.3 kg)  02/26/23 191 lb 6.4 oz (86.8 kg)      Physical Exam Vitals reviewed.  Constitutional:      General: He is not in acute distress. Cardiovascular:     Rate and Rhythm: Normal rate and regular rhythm.     Heart sounds: No murmur heard.    No gallop.  Pulmonary:     Effort: Pulmonary effort is normal.     Breath sounds: Normal breath sounds.  Neurological:     Mental Status: He is alert.      No results found for any visits on 04/08/23.    The 10-year ASCVD risk score (Arnett DK, et al., 2019) is: 9.3%    Assessment & Plan:   Problem List Items Addressed This Visit       Unprioritized   ADHD (attention deficit  hyperactivity disorder) - Primary  Prior intolerance with Vyvanse .  He has taken immediately's Adderall in the past and folic this worked much better with better tolerance.  We agreed to send in Adderall 10 mg twice daily.  Reviewed potential side effects.  Give feedback how this is working in 1 month.  Consider further titration and then if indicated  No follow-ups on file.    Wolm Scarlet, MD

## 2023-04-08 NOTE — Patient Instructions (Signed)
 Give me some feedback in about one month regarding the Adderall.

## 2023-04-15 ENCOUNTER — Ambulatory Visit: Payer: No Typology Code available for payment source | Admitting: Neurology

## 2023-04-15 DIAGNOSIS — R404 Transient alteration of awareness: Secondary | ICD-10-CM | POA: Diagnosis not present

## 2023-04-15 DIAGNOSIS — G3184 Mild cognitive impairment, so stated: Secondary | ICD-10-CM

## 2023-04-15 NOTE — Progress Notes (Signed)
Ambulatory EEG hooked up and running. Light flashing. Push button tested. Camera and event log explained. Batteries explained. Patient understood.

## 2023-04-16 NOTE — Progress Notes (Signed)
AMB EEG discontinued.  Skin Breakdown:No Diary Returned: Yes

## 2023-04-17 ENCOUNTER — Encounter: Payer: Self-pay | Admitting: Neurology

## 2023-04-23 ENCOUNTER — Telehealth: Payer: Self-pay | Admitting: Neurology

## 2023-04-23 NOTE — Telephone Encounter (Signed)
Patient would like to get the results of his 24 hour eeg

## 2023-04-23 NOTE — Telephone Encounter (Signed)
Pt called an informed that results are not back yet once I get the results I will call him back,

## 2023-04-29 ENCOUNTER — Telehealth: Payer: Self-pay | Admitting: *Deleted

## 2023-04-29 NOTE — Telephone Encounter (Signed)
Attempt to reach pt for pre-visit. LM with call back #.  Will attempt to reach again in 5 min due to no other # listed in profile RN noted that pt had a no in the Reminder status of No  Second attempt to reach pt for pre-vist unsuccessful. LM with facility # for pt to call back. Instructed pt to call # given by end of the day (if he accidentally hit no in the Reminder status) and reschedule the pre-visit  with RN or the scheduled procedure will be canceled.

## 2023-04-30 NOTE — Procedures (Signed)
 ELECTROENCEPHALOGRAM REPORT  Dates of Recording: 04/15/2023 10:36AM to 04/16/2023 10:57AM  Patient's Name: Paul Bird MRN: [id number] Date of Birth: 04-27-1960  Referring Provider: Dr. Darice Shivers  Procedure: 24-hour ambulatory video EEG  History: This is a 63 year old man with an episode of transient alteration of awareness with right arm extension. EEG for classification.  Medications: Adderall  Technical Summary: This is a 24-hour multichannel digital video EEG recording measured by the international 10-20 system with electrodes applied with paste and impedances below 5000 ohms performed as portable with EKG monitoring.  The digital EEG was referentially recorded, reformatted, and digitally filtered in a variety of bipolar and referential montages for optimal display.    DESCRIPTION OF RECORDING: During maximal wakefulness, the background activity consisted of a symmetric 10 Hz posterior dominant rhythm which was reactive to eye opening.  There were no epileptiform discharges or focal slowing seen in wakefulness.  During the recording, the patient progresses through wakefulness, drowsiness, and Stage 2 sleep.  Again, there were no epileptiform discharges seen.  Events: There were no push button events or symptoms reported.  There were no electrographic seizures seen. There were technical difficulties with EKG lead. NSR when data was available for review.   IMPRESSION: This 24-hour ambulatory video EEG study is normal.    CLINICAL CORRELATION: A normal EEG does not exclude a clinical diagnosis of epilepsy. Typical events were not captured. If further clinical questions remain, inpatient video EEG monitoring may be helpful.   Darice Shivers, M.D.

## 2023-04-30 NOTE — Telephone Encounter (Signed)
Pt called no answer left a voice mail to call the office back

## 2023-04-30 NOTE — Addendum Note (Signed)
Addended by: Van Clines on: 04/30/2023 11:05 AM   Modules accepted: Orders

## 2023-04-30 NOTE — Telephone Encounter (Signed)
Pls let him know the EEG is normal, no seizure activity seen. Continue with what he has been doing monitoring heart rate. F/u as scheduled in March, call for any change in symptoms. Thanks

## 2023-05-01 NOTE — Telephone Encounter (Signed)
Pt called no answer left a voice mail to call the office back

## 2023-05-02 NOTE — Telephone Encounter (Signed)
Pt called an informed that the EEG is normal, no seizure activity seen. Continue with what he has been doing monitoring heart rate. F/u as scheduled in March, call for any change in symptoms

## 2023-05-05 ENCOUNTER — Ambulatory Visit (AMBULATORY_SURGERY_CENTER): Payer: No Typology Code available for payment source | Admitting: *Deleted

## 2023-05-05 VITALS — Ht 72.0 in | Wt 190.0 lb

## 2023-05-05 DIAGNOSIS — Z860100 Personal history of colon polyps, unspecified: Secondary | ICD-10-CM

## 2023-05-05 MED ORDER — SUFLAVE 178.7 G PO SOLR: 1.0000 | Freq: Once | ORAL | 0 refills | Status: AC

## 2023-05-05 NOTE — Progress Notes (Signed)
Pt's name and DOB verified at the beginning of the pre-visit wit 2 identifiers  Pt denies any difficulty with ambulating,sitting, laying down or rolling side to side  Pt has no issues with ambulation   Pt has no issues moving head neck or swallowing  No egg or soy allergy known to patient   No issues known to pt with past sedation with any surgeries or procedures  Pt denies having issues being intubated  No FH of Malignant Hyperthermia  Pt is not on diet pills or shots  Pt is not on home 02   Pt is not on blood thinners   Pt denies issues with constipation   Pt has frequent issues with constipation RN instructed pt to use Miralax per bottles instructions a week before prep days. Pt states they will  Pt is not on dialysis  Pt hx Bradycardia  2 Weeks ago started having syncope episodes MD aware and testing so far is normal per pt. Pt told for his safety that RN willput him as a risk of fall just due to this issues and pt stated he was OK with this.  Pt denies any upcoming cardiac testing  Pt encouraged to use to use Singlecare or Goodrx to reduce cost   Patient's chart reviewed by Cathlyn Parsons CNRA prior to pre-visit and patient appropriate for the [facility].  Pre-visit completed and red dot placed by patient's name on their procedure day (on provider's schedule).  .  Visit by phone  Pt states weight is 190 lb  Instructed pt why it is important to and  to call if they have any changes in health or new medications. Directed them to the # given and on instructions.     Instructions reviewed. Pt given both [facility] main # and MD on call # prior to instructions.  Pt states understanding. Instructed to review again prior to procedure. Pt states they will.   Informed pt that they will receive a call from [facility] regarding there prep med.

## 2023-05-08 ENCOUNTER — Ambulatory Visit: Payer: Self-pay | Admitting: Family Medicine

## 2023-05-08 NOTE — Telephone Encounter (Signed)
 Chief Complaint: Anxiety  Symptoms: panic attacks, SOB, palpations  Frequency: Comes and goes  Pertinent Negatives: Patient denies thoughts of self harm or harm to others  Disposition: [] ED /[] Urgent Care (no appt availability in office) / [x] Appointment(In office/virtual)/ []  [facility]/ [] Home Care/ [] Refused Recommended Disposition /[] [facility]/ []  Follow-up with PCP Additional Notes: Patient states he has been having what he believes to be panic attacks recently and he is unsure what is causing them. Patient states he was evaluated for heart conditions but everything checked out fine but he continues to have episodes of a racing heart, palpations and SOB. Patient states he has not discussed this with his PCP yet. Care advice was given and patient has been scheduled for an OV tomorrow afternoon with PCP.   Copied from CRM [id number]. Topic: Clinical - Red Word Triage >> May 08, 2023  9:17 AM Isabell A wrote: Red Word that prompted transfer to Nurse Triage:  Patient states he's been experiencing panic attacks, symptoms are like ongoing - starts in the head, nervousness, hard to catch his breath, feels like his heart is rushing, tries to control it, heart beat very fluttered. Reason for Disposition  [1] Anxiety symptoms AND [2] has not been evaluated for this by doctor (or NP/PA)  Answer Assessment - Initial Assessment Questions 1. CONCERN: Did anything happen that prompted you to call today?      Feeling like I'm having panic attacks  2. ANXIETY SYMPTOMS: Can you describe how you (your loved one; patient) have been feeling? (e.g., tense, restless, panicky, anxious, keyed up, overwhelmed, sense of impending doom).      Nervousness 3. ONSET: How long have you been feeling this way? (e.g., hours, days, weeks)     A few weeks  4. SEVERITY: How would you rate the level of anxiety? (e.g., 0 - 10; or mild, moderate, severe).     9/10 5. FUNCTIONAL IMPAIRMENT: How have  these feelings affected your ability to do daily activities? Have you had more difficulty than usual doing your normal daily activities? (e.g., getting better, same, worse; self-care, school, work, interactions)     Yes 6. HISTORY: Have you felt this way before? Have you ever been diagnosed with an anxiety problem in the past? (e.g., generalized anxiety disorder, panic attacks, PTSD). If Yes, ask: How was this problem treated? (e.g., medicines, counseling, etc.)     No  7. RISK OF HARM - SUICIDAL IDEATION: Do you ever have thoughts of hurting or killing yourself? If Yes, ask:  Do you have these feelings now? Do you have a plan on how you would do this?     No  8. TREATMENT:  What has been done so far to treat this anxiety? (e.g., medicines, relaxation strategies). What has helped?     Relaxation strategies  9. TREATMENT - THERAPIST: Do you have a counselor or therapist? Name?     No  10. POTENTIAL TRIGGERS: Do you drink caffeinated beverages (e.g., coffee, colas, teas), and how much daily? Do you drink alcohol or use any drugs? Have you started any new medicines recently?       I have a coffee in the morning 11. PATIENT SUPPORT: Who is with you now? Who do you live with? Do you have family or friends who you can talk to?        My wife and family  6. OTHER SYMPTOMS: Do you have any other symptoms? (e.g., feeling depressed, trouble concentrating, trouble sleeping,  trouble breathing, palpitations or fast heartbeat, chest pain, sweating, nausea, or diarrhea)       Palpitations, trouble breathing  Protocols used: Anxiety and Panic Attack-A-AH

## 2023-05-08 NOTE — Telephone Encounter (Signed)
 Noted.   Agree with in office assessment.  Marquetta Sit MD [facility]

## 2023-05-09 ENCOUNTER — Telehealth: Payer: Self-pay | Admitting: Neurology

## 2023-05-09 ENCOUNTER — Encounter: Payer: Self-pay | Admitting: Family Medicine

## 2023-05-09 ENCOUNTER — Ambulatory Visit (INDEPENDENT_AMBULATORY_CARE_PROVIDER_SITE_OTHER): Payer: No Typology Code available for payment source | Admitting: Family Medicine

## 2023-05-09 VITALS — BP 134/80 | HR 69 | Temp 98.1°F | Wt 197.5 lb

## 2023-05-09 DIAGNOSIS — F419 Anxiety disorder, unspecified: Secondary | ICD-10-CM

## 2023-05-09 MED ORDER — SERTRALINE HCL 50 MG PO TABS: 50.0000 mg | ORAL_TABLET | Freq: Every day | ORAL | 1 refills | Status: DC

## 2023-05-09 NOTE — Progress Notes (Signed)
 Established Patient Office Visit  Subjective   Patient ID: Paul Bird, male    DOB: 01-24-1961  Age: 63 y.o. MRN: [id number]  Chief Complaint  Patient presents with   Anxiety    HPI   Paul Bird is here to discuss increased anxiety symptoms.  He is generally very healthy.  He was admitted following syncopal episode and some sinus bradycardia back in October.  He has had extensive neurologic and cardiac workup since then essentially unrevealing.  EEG showed no seizure activity.  CT morphology study showed coronary calcium  score 1.37 with no significant plaque.  He continues to exercise regularly and has no exercise intolerance.  His girlfriend is with him and states that generally about 2 or 3 times a week he has episodes of discrete anxiety that usually last about 4 minutes.  He has multiple physical symptoms including palpitations, dyspnea, sweats, and occasional chest tightness.  This has never been exertional.  They did some reading and had concerns may be related to panic disorder.  He drinks about 2 cups of coffee per day but has done so for quite some time.  He has given up alcohol completely recently.  He does take low-dose Adderall but does not feel like this correlates necessarily with his symptoms.  He has had these types of severe anxiety symptoms even when not taking the Adderall.  He has had some ongoing memory concerns and is followed by neurology.  They had proposed LP to do some further studies for dementia but those are still pending.  Past Medical History:  Diagnosis Date   Actinic keratosis 04/19/2009   Adenomatous polyp    ADHD (attention deficit hyperactivity disorder) 10/12/2013   Bradycardia    Cataract    Dysesthesia 09/08/2009   Erectile dysfunction 04/19/2009   Qualifier: Diagnosis of   By: Arbutus Noon       Melanoma of skin 11/19/2017   Clark's stage I, depth 0.2 mm   Mild cognitive impairment of uncertain or unknown etiology 04/19/2022   Syncope     Started 2 weeks ago 1/25 MD  aware per pt   Vitamin B12 deficiency    Past Surgical History:  Procedure Laterality Date   COLONOSCOPY     melanoma removal  2019   neck and chest    NECK SURGERY     POLYPECTOMY     ROTATOR CUFF REPAIR  2001   right    reports that he has never smoked. He has never used smokeless tobacco. He reports current alcohol use. He reports that he does not use drugs. family history includes Dementia in his mother; Heart disease (age of onset: 63) in his father; Memory loss in his mother. No Known Allergies  Review of Systems  Respiratory:  Negative for cough and shortness of breath.   Cardiovascular:        See HPI  Gastrointestinal:  Negative for abdominal pain.  Genitourinary:  Negative for dysuria.  Psychiatric/Behavioral:  Negative for depression. The patient is nervous/anxious.       Objective:     BP 134/80 (BP Location: Left Arm, Patient Position: Sitting, Cuff Size: Normal)   Pulse 69   Temp 98.1 F (36.7 C) (Oral)   Wt 197 lb 8 oz (89.6 kg)   SpO2 96%   BMI 26.79 kg/m  BP Readings from Last 3 Encounters:  05/09/23 134/80  04/08/23 130/66  03/19/23 (!) 146/83   Wt Readings from Last 3 Encounters:  05/09/23 197 lb 8  oz (89.6 kg)  05/05/23 190 lb (86.2 kg)  04/08/23 195 lb 6.4 oz (88.6 kg)      Physical Exam Vitals reviewed.  Constitutional:      General: He is not in acute distress.    Appearance: He is not ill-appearing.  HENT:     Head: Normocephalic and atraumatic.  Cardiovascular:     Rate and Rhythm: Normal rate and regular rhythm.  Pulmonary:     Effort: Pulmonary effort is normal.     Breath sounds: Normal breath sounds. No wheezing or rales.  Neurological:     Mental Status: He is alert.      No results found for any visits on 05/09/23.  Last CBC Lab Results  Component Value Date   WBC 7.2 02/26/2023   HGB 14.2 02/26/2023   HCT 43.6 02/26/2023   MCV 82.1 02/26/2023   MCH 26.1 01/27/2023   RDW 15.2  02/26/2023   PLT 227.0 02/26/2023   Last metabolic panel Lab Results  Component Value Date   GLUCOSE 92 02/26/2023   NA 140 02/26/2023   K 4.9 02/26/2023   CL 104 02/26/2023   CO2 30 02/26/2023   BUN 20 02/26/2023   CREATININE 1.04 02/26/2023   GFR 77.20 02/26/2023   CALCIUM  9.6 02/26/2023   PROT 7.1 02/26/2023   ALBUMIN 4.3 02/26/2023   BILITOT 0.6 02/26/2023   ALKPHOS 68 02/26/2023   AST 26 02/26/2023   ALT 23 02/26/2023   ANIONGAP 5 01/27/2023   Last lipids Lab Results  Component Value Date   CHOL 183 02/26/2023   HDL 53.10 02/26/2023   LDLCALC 119 (H) 02/26/2023   LDLDIRECT 154.3 04/19/2009   TRIG 56.0 02/26/2023   CHOLHDL 3 02/26/2023   Last thyroid  functions Lab Results  Component Value Date   TSH 1.126 01/27/2023      The 10-year ASCVD risk score (Arnett DK, et al., 2019) is: 9.8%    Assessment & Plan:   Paul Bird is seen with several month history of intermittent episodes of discrete anxiety which are quite intense and frequently last up around 5 minutes.  He has physical symptoms as above including palpitations, sweats, dyspnea, shortness of breath, chest discomfort.  Cardiac evaluation thus far has been unremarkable.  Symptoms do sound consistent with likely panic attacks.  -Continue to minimize caffeine and alcohol intake -Consider trial of sertraline  50 mg once daily and reassess in 3 weeks.  We did review potential side effects.  Wolm Scarlet, MD

## 2023-05-09 NOTE — Telephone Encounter (Signed)
 Pt. Has not heard from anyone to schedule his spinal tap and followup with Paul Bird is set for 3/15 but no spinal tap done yet

## 2023-05-12 ENCOUNTER — Other Ambulatory Visit: Payer: Self-pay

## 2023-05-12 DIAGNOSIS — G3184 Mild cognitive impairment, so stated: Secondary | ICD-10-CM

## 2023-05-12 DIAGNOSIS — R4189 Other symptoms and signs involving cognitive functions and awareness: Secondary | ICD-10-CM

## 2023-05-12 DIAGNOSIS — R404 Transient alteration of awareness: Secondary | ICD-10-CM

## 2023-05-12 NOTE — Telephone Encounter (Signed)
 I thought they wanted to hold off, but looks like they want to proceed. Pls send order for LP under fluoro, I will send CSF labs as staff message, thanks

## 2023-05-13 ENCOUNTER — Other Ambulatory Visit: Payer: Self-pay | Admitting: Family Medicine

## 2023-05-13 ENCOUNTER — Telehealth: Payer: Self-pay

## 2023-05-13 MED ORDER — SUFLAVE 178.7 G PO SOLR: 1.0000 | Freq: Once | ORAL | 0 refills | Status: AC

## 2023-05-13 NOTE — Telephone Encounter (Signed)
Copied from CRM [id number]. Topic: Clinical - Medication Refill >> May 13, 2023  2:07 PM Desma Mcgregor wrote: Most Recent Primary Care Visit:  Provider: Kristian Covey  Department: [facility]  Visit Type: ACUTE  Date: 05/09/2023  Medication: amphetamine-dextroamphetamine (ADDERALL) 10 MG tablet  Has the patient contacted their pharmacy? Yes. Pharmacy advised to contact dr to approve refill  Is this the correct pharmacy for this prescription? Yes If no, delete pharmacy and type the correct one.  This is the patient's preferred pharmacy:  [id number] - [place], [place] - [address] [place] [address] Phone: [phone] Fax: [phone]  Has the prescription been filled recently? Yes  Is the patient out of the medication? No  Has the patient been seen for an appointment in the last year OR does the patient have an upcoming appointment? Yes  Can we respond through MyChart? Yes  Agent: Please be advised that Rx refills may take up to 3 business days. We ask that you follow-up with your pharmacy.

## 2023-05-13 NOTE — Telephone Encounter (Signed)
I left a detailed message for the patient that he needs to contact [facility] to arrange for delivery of his prep for his upcoming colonoscopy on 05/20/23, or to call the office to arrange the transfer to a local pharmacy.

## 2023-05-13 NOTE — Telephone Encounter (Signed)
Last Fill: 04/08/23   Last OV: 05/09/23 Next OV: 06/02/23  Routing to provider for review/authorization.

## 2023-05-13 NOTE — Telephone Encounter (Signed)
LP info faxed to [facility]

## 2023-05-13 NOTE — Telephone Encounter (Signed)
Pt called no answer when he calls back will let him know that [facility] will be calling to get him scheduled for him LP

## 2023-05-13 NOTE — Telephone Encounter (Signed)
Patient returned call .  Rx sent to [facility] at his request

## 2023-05-14 NOTE — Telephone Encounter (Signed)
LP is scheduled.

## 2023-05-15 MED ORDER — AMPHETAMINE-DEXTROAMPHETAMINE 10 MG PO TABS: 10.0000 mg | ORAL_TABLET | Freq: Two times a day (BID) | ORAL | 0 refills | Status: AC

## 2023-05-15 MED ORDER — AMPHETAMINE-DEXTROAMPHETAMINE 10 MG PO TABS: 10.0000 mg | ORAL_TABLET | Freq: Two times a day (BID) | ORAL | 0 refills | Status: DC

## 2023-05-16 ENCOUNTER — Encounter: Payer: Self-pay | Admitting: Internal Medicine

## 2023-05-20 ENCOUNTER — Encounter: Payer: Self-pay | Admitting: Internal Medicine

## 2023-05-20 ENCOUNTER — Ambulatory Visit (AMBULATORY_SURGERY_CENTER): Payer: No Typology Code available for payment source | Admitting: Internal Medicine

## 2023-05-20 VITALS — BP 117/66 | HR 67 | Temp 97.3°F | Resp 16 | Ht 72.0 in | Wt 190.0 lb

## 2023-05-20 DIAGNOSIS — Z860101 Personal history of adenomatous and serrated colon polyps: Secondary | ICD-10-CM | POA: Diagnosis not present

## 2023-05-20 DIAGNOSIS — Z1211 Encounter for screening for malignant neoplasm of colon: Secondary | ICD-10-CM

## 2023-05-20 DIAGNOSIS — K648 Other hemorrhoids: Secondary | ICD-10-CM | POA: Diagnosis not present

## 2023-05-20 DIAGNOSIS — K5730 Diverticulosis of large intestine without perforation or abscess without bleeding: Secondary | ICD-10-CM | POA: Diagnosis not present

## 2023-05-20 DIAGNOSIS — Z860100 Personal history of colon polyps, unspecified: Secondary | ICD-10-CM

## 2023-05-20 MED ORDER — SODIUM CHLORIDE 0.9 % IV SOLN: 500.0000 mL | Freq: Once | INTRAVENOUS | Status: DC

## 2023-05-20 NOTE — Progress Notes (Signed)
 Report to PACU, RN, vss, BBS= Clear.

## 2023-05-20 NOTE — Patient Instructions (Signed)
 Please read handouts provided. Continue present medications. Resume previous diet. Repeat colonoscopy in 7 years for screening.   YOU HAD AN ENDOSCOPIC PROCEDURE TODAY AT THE [facility]:   Refer to the procedure report that was given to you for any specific questions about what was found during the examination.  If the procedure report does not answer your questions, please call your gastroenterologist to clarify.  If you requested that your care partner not be given the details of your procedure findings, then the procedure report has been included in a sealed envelope for you to review at your convenience later.  YOU SHOULD EXPECT: Some feelings of bloating in the abdomen. Passage of more gas than usual.  Walking can help get rid of the air that was put into your GI tract during the procedure and reduce the bloating. If you had a lower endoscopy (such as a colonoscopy or flexible sigmoidoscopy) you may notice spotting of blood in your stool or on the toilet paper. If you underwent a bowel prep for your procedure, you may not have a normal bowel movement for a few days.  Please Note:  You might notice some irritation and congestion in your nose or some drainage.  This is from the oxygen used during your procedure.  There is no need for concern and it should clear up in a day or so.  SYMPTOMS TO REPORT IMMEDIATELY:  Following lower endoscopy (colonoscopy or flexible sigmoidoscopy):  Excessive amounts of blood in the stool  Significant tenderness or worsening of abdominal pains  Swelling of the abdomen that is new, acute  Fever of 100F or higher.  For urgent or emergent issues, a gastroenterologist can be reached at any hour by calling [phone]. Do not use MyChart messaging for urgent concerns.    DIET:  We do recommend a small meal at first, but then you may proceed to your regular diet.  Drink plenty of fluids but you should avoid alcoholic beverages for 24  hours.  ACTIVITY:  You should plan to take it easy for the rest of today and you should NOT DRIVE or use heavy machinery until tomorrow (because of the sedation medicines used during the test).    FOLLOW UP: Our staff will call the number listed on your records the next business day following your procedure.  We will call around 7:15- 8:00 am to check on you and address any questions or concerns that you may have regarding the information given to you following your procedure. If we do not reach you, we will leave a message.     If any biopsies were taken you will be contacted by phone or by letter within the next 1-3 weeks.  Please call us at [phone] if you have not heard about the biopsies in 3 weeks.    SIGNATURES/CONFIDENTIALITY: You and/or your care partner have signed paperwork which will be entered into your electronic medical record.  These signatures attest to the fact that that the information above on your After Visit Summary has been reviewed and is understood.  Full responsibility of the confidentiality of this discharge information lies with you and/or your care-partner.

## 2023-05-20 NOTE — Op Note (Signed)
 [facility] Patient Name: Paul Bird Procedure Date: 05/20/2023 10:25 AM MRN: [id number] Endoscopist: Beverley Fiedler , MD, [id number] Age: 63 Referring MD:  Date of Birth: 1960/05/25 Gender: Male Account #: [id number] Procedure:                Colonoscopy Indications:              High risk colon cancer surveillance: Personal                            history of multiple adenomas, Last colonoscopy:                            December 2019 (TA x 1), July 2016 (TA x 4) Medicines:                Monitored Anesthesia Care Procedure:                Pre-Anesthesia Assessment:                           - Prior to the procedure, a History and Physical                            was performed, and patient medications and                            allergies were reviewed. The patient's tolerance of                            previous anesthesia was also reviewed. The risks                            and benefits of the procedure and the sedation                            options and risks were discussed with the patient.                            All questions were answered, and informed consent                            was obtained. Prior Anticoagulants: The patient has                            taken no anticoagulant or antiplatelet agents. ASA                            Grade Assessment: II - A patient with mild systemic                            disease. After reviewing the risks and benefits,                            the patient was deemed in satisfactory condition to  undergo the procedure.                           After obtaining informed consent, the colonoscope                            was passed under direct vision. Throughout the                            procedure, the patient's blood pressure, pulse, and                            oxygen saturations were monitored continuously. The                            Olympus CF-HQ190L  ([id number]) Colonoscope was                            introduced through the anus and advanced to the                            cecum, identified by appendiceal orifice and                            ileocecal valve. The colonoscopy was performed                            without difficulty. The patient tolerated the                            procedure well. The quality of the bowel                            preparation was good. The ileocecal valve,                            appendiceal orifice, and rectum were photographed. Scope In: 10:34:02 AM Scope Out: 10:45:56 AM Scope Withdrawal Time: 0 hours 9 minutes 20 seconds  Total Procedure Duration: 0 hours 11 minutes 54 seconds  Findings:                 The digital rectal exam was normal.                           Scattered small-mouthed diverticula were found in                            the sigmoid colon, descending colon and distal                            transverse colon.                           Internal hemorrhoids were found during  retroflexion. The hemorrhoids were small.                           The exam was otherwise without abnormality. Complications:            No immediate complications. Estimated Blood Loss:     Estimated blood loss: none. Impression:               - Mild diverticulosis in the sigmoid colon, in the                            descending colon and in the distal transverse colon.                           - Small internal hemorrhoids.                           - The examination was otherwise normal.                           - No specimens collected. Recommendation:           - Patient has a contact number available for                            emergencies. The signs and symptoms of potential                            delayed complications were discussed with the                            patient. Return to normal activities tomorrow.                             Written discharge instructions were provided to the                            patient.                           - Resume previous diet.                           - Continue present medications.                           - Repeat colonoscopy in 7 years for surveillance. Beverley Fiedler, MD 05/20/2023 10:50:18 AM This report has been signed electronically.

## 2023-05-20 NOTE — Progress Notes (Signed)
 GASTROENTEROLOGY PROCEDURE H&P NOTE   Primary Care Physician: Kristian Covey, MD    Reason for Procedure:   Hx of polyps  Plan:    colonoscopy  Patient is appropriate for endoscopic procedure(s) in the ambulatory ([facility]) setting.  The nature of the procedure, as well as the risks, benefits, and alternatives were carefully and thoroughly reviewed with the patient. Ample time for discussion and questions allowed. The patient understood, was satisfied, and agreed to proceed.     HPI: Paul Bird is a 63 y.o. male who presents for colonoscopy.  Medical history as below.  Tolerated the prep.  No recent chest pain or shortness of breath.  No abdominal pain today.  Past Medical History:  Diagnosis Date   Actinic keratosis 04/19/2009   Adenomatous polyp    ADHD (attention deficit hyperactivity disorder) 10/12/2013   Bradycardia    Cataract    Dysesthesia 09/08/2009   Erectile dysfunction 04/19/2009   Qualifier: Diagnosis of   By: Rita Ohara       Melanoma of skin 11/19/2017   Clark's stage I, depth 0.2 mm   Mild cognitive impairment of uncertain or unknown etiology 04/19/2022   Syncope    Started 2 weeks ago 1/25 MD  aware per pt   Vitamin B12 deficiency     Past Surgical History:  Procedure Laterality Date   COLONOSCOPY     melanoma removal  2019   neck and chest    NECK SURGERY     POLYPECTOMY     ROTATOR CUFF REPAIR  2001   right    Prior to Admission medications   Medication Sig Start Date End Date Taking? Authorizing Provider  amphetamine-dextroamphetamine (ADDERALL) 10 MG tablet Take 1 tablet (10 mg total) by mouth 2 (two) times daily with a meal. 05/15/23  Yes Burchette, Elberta Fortis, MD  amphetamine-dextroamphetamine (ADDERALL) 10 MG tablet Take 1 tablet (10 mg total) by mouth 2 (two) times daily. 05/15/23  Yes Burchette, Elberta Fortis, MD  aspirin 81 MG chewable tablet Chew by mouth daily.   Yes [provider]  Cholecalciferol (VITAMIN D3) 25 MCG  (1000 UT) CAPS Take by mouth.   Yes [provider]  Omega-3 Fatty Acids (FISH OIL) 500 MG CAPS Take by mouth.   Yes [provider]  sertraline (ZOLOFT) 50 MG tablet Take 1 tablet (50 mg total) by mouth daily. 05/09/23  Yes Burchette, Elberta Fortis, MD    Current Outpatient Medications  Medication Sig Dispense Refill   amphetamine-dextroamphetamine (ADDERALL) 10 MG tablet Take 1 tablet (10 mg total) by mouth 2 (two) times daily with a meal. 60 tablet 0   amphetamine-dextroamphetamine (ADDERALL) 10 MG tablet Take 1 tablet (10 mg total) by mouth 2 (two) times daily. 60 tablet 0   aspirin 81 MG chewable tablet Chew by mouth daily.     Cholecalciferol (VITAMIN D3) 25 MCG (1000 UT) CAPS Take by mouth.     Omega-3 Fatty Acids (FISH OIL) 500 MG CAPS Take by mouth.     sertraline (ZOLOFT) 50 MG tablet Take 1 tablet (50 mg total) by mouth daily. 30 tablet 1   Current Facility-Administered Medications  Medication Dose Route Frequency Provider Last Rate Last Admin   0.9 %  sodium chloride infusion  500 mL Intravenous Once Petra Dumler, Carie Caddy, MD        Allergies as of 05/20/2023   (No Known Allergies)    Family History  Problem Relation Age of Onset   Dementia  Mother        40s   Memory loss Mother    Heart disease Father 9       ?viral myocarditis   Colon cancer Neg Hx    Colon polyps Neg Hx    Rectal cancer Neg Hx    Ulcerative colitis Neg Hx    Stomach cancer Neg Hx    Esophageal cancer Neg Hx     Social History   Socioeconomic History   Marital status: Married    Spouse name: Not on file   Number of children: Not on file   Years of education: 12   Highest education level: Some college, no degree  Occupational History   Occupation: Customer Relations  Tobacco Use   Smoking status: Never   Smokeless tobacco: Never  Vaping Use   Vaping status: Never Used  Substance and Sexual Activity   Alcohol use: Yes    Comment: once every 1-2 weeks   Drug use: No   Sexual  activity: Not on file  Other Topics Concern   Not on file  Social History Narrative   Are you right handed or left handed? Left handed   Are you currently employed ? 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 No    Who lives with you? Wife    What type of home do you live in: 1 story or 2 story? 1 story with basement        Social Drivers of Health   Financial Resource Strain: Low Risk  (04/05/2023)   Overall Financial Resource Strain (CARDIA)    Difficulty of Paying Living Expenses: Not hard at all  Food Insecurity: No Food Insecurity (04/05/2023)   Hunger Vital Sign    Worried About Running Out of Food in the Last Year: Never true    Ran Out of Food in the Last Year: Never true  Transportation Needs: No Transportation Needs (04/05/2023)   PRAPARE - Administrator, Civil Service (Medical): No    Lack of Transportation (Non-Medical): No  Physical Activity: Sufficiently Active (04/05/2023)   Exercise Vital Sign    Days of Exercise per Week: 5 days    Minutes of Exercise per Session: 60 min  Stress: No Stress Concern Present (04/05/2023)   [employer] of Occupational Health - Occupational Stress Questionnaire    Feeling of Stress : Not at all  Social Connections: Socially Integrated (04/05/2023)   Social Connection and Isolation Panel [NHANES]    Frequency of Communication with Friends and Family: Three times a week    Frequency of Social Gatherings with Friends and Family: More than three times a week    Attends Religious Services: More than 4 times per year    Active Member of [employer] or Organizations: Yes    Attends Engineer, structural: More than 4 times per year    Marital Status: Married  Catering manager Violence: Not At Risk (01/26/2023)   Humiliation, Afraid, Rape, and Kick questionnaire    Fear of Current or Ex-Partner: No    Emotionally Abused: No    Physically Abused: No    Sexually Abused: No    Physical  Exam: Vital signs in last 24 hours: @BP  124/86   Pulse 97   Temp (!) 97.3 F (36.3 C)   Ht 6' (1.829 m)   Wt 190 lb (86.2 kg)   SpO2 100%   BMI 25.77 kg/m  GEN: NAD EYE: Sclerae anicteric ENT: MMM CV: Non-tachycardic Pulm: CTA b/l GI: Soft, NT/ND NEURO:  Alert & Oriented x 3   Erick Blinks, MD [facility]  05/20/2023 10:27 AM

## 2023-05-20 NOTE — Progress Notes (Signed)
 Pt's states no medical or surgical changes since previsit or office visit.

## 2023-05-21 ENCOUNTER — Telehealth: Payer: Self-pay

## 2023-05-21 NOTE — Discharge Instructions (Signed)

## 2023-05-21 NOTE — Telephone Encounter (Signed)
  Follow up Call-     05/20/2023   10:01 AM  Call back number  Post procedure Call Back phone  # [phone]  Permission to leave phone message Yes     Patient questions:  Do you have a fever, pain , or abdominal swelling? No. Pain Score  0 *  Have you tolerated food without any problems? Yes.    Have you been able to return to your normal activities? Yes.    Do you have any questions about your discharge instructions: Diet   No. Medications  No. Follow up visit  No.  Do you have questions or concerns about your Care? No.  Actions: * If pain score is 4 or above: No action needed, pain <4.

## 2023-05-22 ENCOUNTER — Inpatient Hospital Stay
Admission: RE | Admit: 2023-05-22 | Discharge: 2023-05-22 | Disposition: A | Discharge status: Home / Self Care | Payer: No Typology Code available for payment source | Source: Ambulatory Visit | Attending: Neurology | Admitting: Neurology

## 2023-05-30 NOTE — Discharge Instructions (Signed)

## 2023-06-02 ENCOUNTER — Ambulatory Visit: Payer: No Typology Code available for payment source | Admitting: Family Medicine

## 2023-06-02 ENCOUNTER — Ambulatory Visit
Admission: RE | Admit: 2023-06-02 | Discharge: 2023-06-02 | Disposition: A | Discharge status: Home / Self Care | Payer: No Typology Code available for payment source | Source: Ambulatory Visit | Attending: Neurology | Admitting: Neurology

## 2023-06-02 ENCOUNTER — Other Ambulatory Visit (HOSPITAL_COMMUNITY)
Admission: RE | Admit: 2023-06-02 | Discharge: 2023-06-02 | Disposition: A | Discharge status: Home / Self Care | Source: Ambulatory Visit | Attending: Neurology | Admitting: Neurology

## 2023-06-02 ENCOUNTER — Other Ambulatory Visit: Payer: Self-pay | Admitting: Family Medicine

## 2023-06-02 DIAGNOSIS — G3184 Mild cognitive impairment, so stated: Secondary | ICD-10-CM

## 2023-06-02 DIAGNOSIS — R4189 Other symptoms and signs involving cognitive functions and awareness: Secondary | ICD-10-CM | POA: Diagnosis present

## 2023-06-02 DIAGNOSIS — R404 Transient alteration of awareness: Secondary | ICD-10-CM

## 2023-06-02 NOTE — Progress Notes (Signed)
1 vial of blood drawn from pts RAC to be sent off with LP lab work. 1 successful attempt, pt tolerated well. Gauze and tape applied after.  

## 2023-06-03 ENCOUNTER — Ambulatory Visit: Payer: No Typology Code available for payment source | Admitting: Neurology

## 2023-06-03 LAB — CYTOLOGY - NON PAP

## 2023-06-12 ENCOUNTER — Ambulatory Visit (INDEPENDENT_AMBULATORY_CARE_PROVIDER_SITE_OTHER): Payer: No Typology Code available for payment source | Admitting: Neurology

## 2023-06-12 ENCOUNTER — Encounter: Payer: Self-pay | Admitting: Neurology

## 2023-06-12 VITALS — BP 132/76 | HR 69 | Ht 72.0 in | Wt 197.0 lb

## 2023-06-12 DIAGNOSIS — G3184 Mild cognitive impairment, so stated: Secondary | ICD-10-CM

## 2023-06-12 DIAGNOSIS — R404 Transient alteration of awareness: Secondary | ICD-10-CM

## 2023-06-12 NOTE — Patient Instructions (Signed)
 Good to see you!  Continue keeping a calendar of your symptoms  2. I will call once I get the [facility] results  3. Follow-up in 4 months, call for any changes

## 2023-06-12 NOTE — Progress Notes (Signed)
 NEUROLOGY FOLLOW UP OFFICE NOTE  Paul Bird [id number] [birth year]  HISTORY OF PRESENT ILLNESS: I had the pleasure of seeing Paul Bird in follow-up in the neurology clinic on 06/12/2023.  The patient was last seen 3 months ago after an episode of decreased responsiveness on 01/26/23. He is again accompanied by his wife Paul Bird who helps supplement the history today.  Records and images were personally reviewed where available. His 24-hour EEG in 04/2023 was normal, typical events were not captured. He was started on Sertraline 50mg  daily for possible panic attacks but this is causing him to be drained in the mornings that he cannot do his usual early morning workout. He held dose last night. They report that in the past 3 months, he reports rare milder episodes. Last Friday, he had 2-3 in one day. They feel it is more like an anxiety-type feeling, he gets a warning it is coming on and feels chest pressure, he tries to breathe through it and it lasts 2 minutes. No change in heart rate on his Gar[employer] watch. No loss of awareness. Paul Bird reports he had a ton of meetings with the CEO that day. Aside from that time, he reports he may have had one here and there. He denies any staring/unresponsive episodes, gaps in time, olfactory/gustatory hallucinations, focal numbness/tingling/weakness, myoclonic jerks. No headaches, dizziness, vision changes, no falls. He was started on Adderall which has helped with focus, he feels his memory is better. They are writing things down and trying to be more organized.    He had Neuropsychological testing in 04/2022 with a diagnosis of MCI, etiology unclear but AD could not be completely ruled out. He went ahead with a lumbar puncture last 06/02/23 for AD CSF biomarkers, however results are not back yet. CSF cell count, protein/glucose, cultures, cytology, VDRL negative.    History on Initial Assessment 03/19/2023: This is a pleasant 63 year old ambidextrous left hand  dominant man with a history of melanoma, ADHD, mild cognitive impairment, presenting for evaluation of syncope that occurred on 01/26/23. He was at home sitting at the table when he felt a swelling in his chest "like an anxiety attack," he felt dizzy, then Paul Bird reports his head went back, right arm extended up and his eyes rolled a little bit. He was kind of mumbling, she asked if he was okay and speech was "kind of mumbo jumbo." He had turned gray and was sweaty. He stood up and she could tell his balance was off, she does not think he knew who she was and she led him to the recliner to elevate his legs. He kept opening and closing both hands and his mouth, like he was trying to get feeling in them. He was pale, diaphoretic, then several minutes came out of it and said he was fine. He lay on the bed and was able to communicate but was still very pale and diaphoretic. No tongue bite or incontinence. He was refusing to go to the ER, he does not recall much of the ER visit. He does not recall having any headache or focal symptoms. In the ER, he had another episode while talking when suddenly the same look came over his face. He was pale and diaphoretic, she alerted the nurse who noted HR was 27. Notes indicate he had right-sided weakness and numbness that improved significantly in the ER with right arm tingling and visual deficit in the right upper quadrant of right eye, NIHSS 3. They both do not  recall that he had any right-sided symptoms. 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 EEG normal.   Since hospital discharge, he felt the same chest pressure when he was about to exercise a couple of weeks ago. He now has a watch monitoring his heart rate and there was no change in HR. No nausea, olfactory/gustatory hallucinations, focal numbness/tingling/weakness. It lasted 1-2 minutes. Paul Bird denies any staring episodes. She notes that his  memory since then has worsened, he was having more difficulties with directions to different places. Paul Bird notes memory issues for the past 3 years, however since the incident, he is more forgetful and takes a longer time to process to remember something, "like his Harvest Dark is full." He gets frustrated when he cannot answer. It feels like things are in slow motion, he is more fatigued but continues to exercise regularly. He denies any headaches, diplopia, dysarthria/dysphagia, neck/back pain, bowel/bladder dysfunction, tremors, or anosmia. He may get dizzy standing up quickly at times. He gets 6-7 hours of sleep. He denies any palpitations. No recent infections, no new medications however he took Cialis the day prior to the episode. He also took an over the counter product called "HIMS" around that time. He had a normal birth and early development.  There is no history of febrile convulsions, CNS infections such as meningitis/encephalitis, significant traumatic brain injury, neurosurgical procedures, or family history of seizures. His mother had dementia.  Neuropsychological evaluation in 04/2022 indicated a diagnosis of Mild Neurocognitive Disorder. Pattern of performance suggestive of an isolated impairment surrounding complex attention and concentration, there was weakness across delayed retrieval aspects of both verbal and visual memory. Compared to 2021 evaluation, there was notable improvement across semantic fluency, only complex attention exhibited decline. Etiology unclear, overall improvement does not align with typical trajectory of AD, however it could not be completely ruled out. ADHD is currently untreated and may be contributing.    PAST MEDICAL HISTORY: Past Medical History:  Diagnosis Date   Actinic keratosis 04/19/2009   Adenomatous polyp    ADHD (attention deficit hyperactivity disorder) 10/12/2013   Bradycardia    Cataract    Dysesthesia 09/08/2009   Erectile dysfunction 04/19/2009    Qualifier: Diagnosis of   By: Rita Ohara       Melanoma of skin 11/19/2017   Clark's stage I, depth 0.2 mm   Mild cognitive impairment of uncertain or unknown etiology 04/19/2022   Syncope    Started 2 weeks ago 1/25 MD  aware per pt   Vitamin B12 deficiency     MEDICATIONS: Current Outpatient Medications on File Prior to Visit  Medication Sig Dispense Refill   amphetamine-dextroamphetamine (ADDERALL) 10 MG tablet Take 1 tablet (10 mg total) by mouth 2 (two) times daily. 60 tablet 0   aspirin 81 MG chewable tablet Chew by mouth daily.     Cholecalciferol (VITAMIN D3) 25 MCG (1000 UT) CAPS Take by mouth.     Omega-3 Fatty Acids (FISH OIL) 500 MG CAPS Take by mouth.     sertraline (ZOLOFT) 50 MG tablet TAKE 1 TABLET BY MOUTH EVERY DAY 90 tablet 0   No current facility-administered medications on file prior to visit.    ALLERGIES: No Known Allergies  FAMILY HISTORY: Family History  Problem Relation Age of Onset   Dementia Mother        75s   Memory loss Mother    Heart disease Father 16       ?  viral myocarditis   Colon cancer Neg Hx    Colon polyps Neg Hx    Rectal cancer Neg Hx    Ulcerative colitis Neg Hx    Stomach cancer Neg Hx    Esophageal cancer Neg Hx     SOCIAL HISTORY: Social History   Socioeconomic History   Marital status: Married    Spouse name: Not on file   Number of children: Not on file   Years of education: 12   Highest education level: Some college, no degree  Occupational History   Occupation: Customer Relations  Tobacco Use   Smoking status: Never   Smokeless tobacco: Never  Vaping Use   Vaping status: Never Used  Substance and Sexual Activity   Alcohol use: Yes    Comment: once every 1-2 weeks   Drug use: No   Sexual activity: Not on file  Other Topics Concern   Not on file  Social History Narrative   Are you right handed or left handed? Left handed   Are you currently employed ? 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 No    Who lives with you? Wife    What type of home do you live in: 1 story or 2 story? 1 story with basement        Social Drivers of Health   Financial Resource Strain: Low Risk  (04/05/2023)   Overall Financial Resource Strain (CARDIA)    Difficulty of Paying Living Expenses: Not hard at all  Food Insecurity: No Food Insecurity (04/05/2023)   Hunger Vital Sign    Worried About Running Out of Food in the Last Year: Never true    Ran Out of Food in the Last Year: Never true  Transportation Needs: No Transportation Needs (04/05/2023)   PRAPARE - Administrator, Civil Service (Medical): No    Lack of Transportation (Non-Medical): No  Physical Activity: Sufficiently Active (04/05/2023)   Exercise Vital Sign    Days of Exercise per Week: 5 days    Minutes of Exercise per Session: 60 min  Stress: No Stress Concern Present (04/05/2023)   [employer] of Occupational Health - Occupational Stress Questionnaire    Feeling of Stress : Not at all  Social Connections: Socially Integrated (04/05/2023)   Social Connection and Isolation Panel [NHANES]    Frequency of Communication with Friends and Family: Three times a week    Frequency of Social Gatherings with Friends and Family: More than three times a week    Attends Religious Services: More than 4 times per year    Active Member of [employer] or Organizations: Yes    Attends Engineer, structural: More than 4 times per year    Marital Status: Married  Catering manager Violence: Not At Risk (01/26/2023)   Humiliation, Afraid, Rape, and Kick questionnaire    Fear of Current or Ex-Partner: No    Emotionally Abused: No    Physically Abused: No    Sexually Abused: No     PHYSICAL EXAM: Vitals:   06/12/23 1030  BP: 132/76  Pulse: 69  SpO2: 98%   General: No acute distress Head:  Normocephalic. Pinpoint bleeding on right upper lip from shaving accident this morning. Skin/Extremities:  No rash, no edema Neurological Exam: alert and awake. No aphasia or dysarthria. Fund of knowledge is appropriate. Attention and concentration are normal.   Cranial nerves: Pupils equal, round. Extraocular  movements intact with no nystagmus. Visual fields full.  No facial asymmetry.  Motor: moves all extremities symmetrically.   Finger to nose testing intact.  Gait narrow-based and steady, able to tandem walk adequately.  Romberg negative.   IMPRESSION: This is a pleasant 63 yo ambidextrous left hand dominant man with a history of melanoma, ADHD, mild cognitive impairment, with an episode in 12/2022 with chest symptoms then transient alteration of awareness with right arm extension. MRI brain no acute changes, routine and 24-hour EEG normal. He had a transient episode in the ER with bradycardia to 27 bpm. No further significant bradycardia. No further more intense episodes similar to 12/2022, however he has had rare milder episodes of chest pressure lasting 2 minutes with no loss of awareness. Etiology remains unclear, cardiac and neurological workup unrevealing. Considerations include anxiety/panic attacks versus seizure. He is not tolerating Sertraline started as empiric treatment for anxiety and will discuss with PCP. He was advised to continue keep a diary of his symptoms, we discussed option of therapeutic trial with seizure medication and monitoring response. We will discuss again on next visit. We are still awaiting CSF AD biomarkers from lumbar puncture done 10 days ago. Memory seems to have improved with treatment of ADHD. He is aware of  driving laws to stop driving after an episode of loss of awareness until 6 months event-free. Follow-up in 4 months, call for any changes.    Thank you for allowing me to participate in his care.  Please do not hesitate to call for any questions or concerns.    Patrcia Dolly, M.D.   CC: Dr. Caryl Never

## 2023-06-13 ENCOUNTER — Ambulatory Visit: Payer: No Typology Code available for payment source | Admitting: Neurology

## 2023-06-19 LAB — MAYO MISC ORDER 2: PRICE:: 1288

## 2023-07-01 LAB — CRYPTOCOCCAL AG, LTX SCR RFLX TITER
Cryptococcal Ag Screen: NOT DETECTED
MICRO NUMBER:: 16150236
SPECIMEN QUALITY:: ADEQUATE

## 2023-07-01 LAB — CNS IGG SYNTHESIS RATE, CSF+BLOOD
Albumin Serum: 4.1 g/dL (ref 3.6–5.1)
Albumin, CSF: 24.8 mg/dL (ref 8.0–42.0)
CNS-IgG Synthesis Rate: -1.2 mg/(24.h) (ref <=3.3)
IgG (Immunoglobin G), Serum: 1580 mg/dL — ABNORMAL HIGH (ref 600–1540)
IgG Total CSF: 5.2 mg/dL (ref 0.8–7.7)
IgG-Index: 0.54 (ref <0.70)

## 2023-07-01 LAB — CSF CULTURE W GRAM STAIN
MICRO NUMBER:: 16150238
Result:: NO GROWTH
SPECIMEN QUALITY:: ADEQUATE

## 2023-07-01 LAB — GLUCOSE, CSF: Glucose, CSF: 61 mg/dL (ref 40–80)

## 2023-07-01 LAB — CSF CELL COUNT WITH DIFFERENTIAL
RBC Count, CSF: 0 {cells}/uL
TOTAL NUCLEATED CELL: 2 {cells}/uL (ref 0–5)

## 2023-07-01 LAB — FUNGUS CULTURE W SMEAR
CULTURE:: NO GROWTH
MICRO NUMBER:: 16150237
SMEAR:: NONE SEEN
SPECIMEN QUALITY:: ADEQUATE

## 2023-07-01 LAB — MAYO MISC ORDER: PRICE:: 1740.8

## 2023-07-01 LAB — VDRL, CSF: VDRL Quant, CSF: NONREACTIVE

## 2023-07-01 LAB — PROTEIN, CSF: Total Protein, CSF: 54 mg/dL (ref 15–60)

## 2023-09-03 ENCOUNTER — Other Ambulatory Visit: Payer: Self-pay | Admitting: Family Medicine

## 2023-09-24 ENCOUNTER — Encounter: Payer: Self-pay | Admitting: Family Medicine

## 2023-09-24 ENCOUNTER — Ambulatory Visit: Admitting: Family Medicine

## 2023-09-24 ENCOUNTER — Ambulatory Visit: Payer: Self-pay | Admitting: Family Medicine

## 2023-09-24 VITALS — BP 110/70 | HR 62 | Temp 97.7°F | Wt 185.0 lb

## 2023-09-24 DIAGNOSIS — R42 Dizziness and giddiness: Secondary | ICD-10-CM

## 2023-09-24 LAB — CBC WITH DIFFERENTIAL/PLATELET
Basophils Absolute: 0.1 10*3/uL (ref 0.0–0.1)
Basophils Relative: 0.8 % (ref 0.0–3.0)
Eosinophils Absolute: 0.3 10*3/uL (ref 0.0–0.7)
Eosinophils Relative: 4.2 % (ref 0.0–5.0)
HCT: 44.1 % (ref 39.0–52.0)
Hemoglobin: 14.8 g/dL (ref 13.0–17.0)
Lymphocytes Relative: 28.5 % (ref 12.0–46.0)
Lymphs Abs: 1.9 10*3/uL (ref 0.7–4.0)
MCHC: 33.5 g/dL (ref 30.0–36.0)
MCV: 88.2 fl (ref 78.0–100.0)
Monocytes Absolute: 0.5 10*3/uL (ref 0.1–1.0)
Monocytes Relative: 8 % (ref 3.0–12.0)
Neutro Abs: 3.8 10*3/uL (ref 1.4–7.7)
Neutrophils Relative %: 58.5 % (ref 43.0–77.0)
Platelets: 252 10*3/uL (ref 150.0–400.0)
RBC: 5 Mil/uL (ref 4.22–5.81)
RDW: 13.1 % (ref 11.5–15.5)
WBC: 6.5 10*3/uL (ref 4.0–10.5)

## 2023-09-24 LAB — COMPREHENSIVE METABOLIC PANEL WITH GFR
ALT: 21 U/L (ref 0–53)
AST: 25 U/L (ref 0–37)
Albumin: 4.7 g/dL (ref 3.5–5.2)
Alkaline Phosphatase: 77 U/L (ref 39–117)
BUN: 22 mg/dL (ref 6–23)
CO2: 31 meq/L (ref 19–32)
Calcium: 9.7 mg/dL (ref 8.4–10.5)
Chloride: 101 meq/L (ref 96–112)
Creatinine, Ser: 1.1 mg/dL (ref 0.40–1.50)
GFR: 71.89 mL/min (ref >60.00)
Glucose, Bld: 115 mg/dL — ABNORMAL HIGH (ref 70–99)
Potassium: 5 meq/L (ref 3.5–5.1)
Sodium: 137 meq/L (ref 135–145)
Total Bilirubin: 0.5 mg/dL (ref 0.2–1.2)
Total Protein: 8.3 g/dL (ref 6.0–8.3)

## 2023-09-24 LAB — MAGNESIUM: Magnesium: 2.4 mg/dL (ref 1.5–2.5)

## 2023-09-24 NOTE — Progress Notes (Signed)
 Established Patient Office Visit  Subjective   Patient ID: Paul Bird, male    DOB: [birth year]  Age: 63 y.o. MRN: [id number]  Chief Complaint  Patient presents with   Medical Management of Chronic Issues    HPI   Paul Bird has past medical history significant for melanoma, ADD, B12 deficiency, mild cognitive impairment.  Seen today with some recent lightheadedness and dizziness.  Occurred mid afternoon.  He had a day recently where he did not remember much of what happened during the day.  Has been evaluated by neurologist as recently as this past March.  He had episode of decreased responsiveness last October.  Had 24-hour EEG which was normal.  Had some anxiety symptoms and was started on sertraline  but did not tolerate medication.  No longer taking any antianxiety medication.  He had some atypical chest symptoms and had CT morphology study there in the past year with coronary calcium  score of just 1 and no significant CAD.  He has B12 deficiency which is replaced and does regular injections for that.  Had neuropsychological testing 2024 which showed mild cognitive impairment.  Underwent lumbar puncture in March for Alzheimer disease CSF markers.  These apparently by report came back negative  He does fairly intense crosstraining type workouts and has had no recent issues with exercise intolerance.  No chest pain.  Blood pressures have been somewhat on the lowish side recently.  He does try to drink plenty and is taking in some electrolyte replacement.  No obvious seizure activity recently.  MRI brain last October showed no acute intracranial abnormality.  Mention of old infarct to the lateral left cerebellar hemisphere.  Past Medical History:  Diagnosis Date   Actinic keratosis 04/19/2009   Adenomatous polyp    ADHD (attention deficit hyperactivity disorder) 10/12/2013   Bradycardia    Cataract    Dysesthesia 09/08/2009   Erectile dysfunction 04/19/2009   Qualifier: Diagnosis of    By: Arbutus Noon       Melanoma of skin 11/19/2017   Clark's stage I, depth 0.2 mm   Mild cognitive impairment of uncertain or unknown etiology 04/19/2022   Syncope    Started 2 weeks ago 1/25 MD  aware per pt   Vitamin B12 deficiency    Past Surgical History:  Procedure Laterality Date   COLONOSCOPY     melanoma removal  2019   neck and chest    NECK SURGERY     POLYPECTOMY     ROTATOR CUFF REPAIR  2001   right    reports that he has never smoked. He has never used smokeless tobacco. He reports current alcohol use. He reports that he does not use drugs. family history includes Dementia in his mother; Heart disease (age of onset: 30) in his father; Memory loss in his mother. No Known Allergies  Review of Systems  Constitutional:  Negative for chills and fever.  Cardiovascular:  Negative for chest pain.  Gastrointestinal:  Negative for abdominal pain.  Genitourinary:  Negative for dysuria.  Neurological:  Positive for dizziness. Negative for speech change, focal weakness, seizures, loss of consciousness and headaches.      Objective:     BP 110/70 (BP Location: Left Arm, Patient Position: Sitting, Cuff Size: Normal)   Pulse 62   Temp 97.7 F (36.5 C) (Oral)   Wt 185 lb (83.9 kg)   SpO2 96%   BMI 25.09 kg/m  BP Readings from Last 3 Encounters:  09/24/23 110/70  06/12/23  132/76  06/02/23 (!) 147/82   Wt Readings from Last 3 Encounters:  09/24/23 185 lb (83.9 kg)  06/12/23 197 lb (89.4 kg)  05/20/23 190 lb (86.2 kg)      Physical Exam Vitals reviewed.  Constitutional:      General: He is not in acute distress.    Appearance: He is not ill-appearing.   Cardiovascular:     Rate and Rhythm: Normal rate and regular rhythm.  Pulmonary:     Effort: Pulmonary effort is normal.     Breath sounds: Normal breath sounds. No wheezing or rales.   Neurological:     General: No focal deficit present.     Mental Status: He is alert.     Cranial Nerves: No cranial  nerve deficit.     Motor: No weakness.     Coordination: Coordination normal.     Gait: Gait normal.      No results found for any visits on 09/24/23.  Last CBC Lab Results  Component Value Date   WBC 6.5 09/24/2023   HGB 14.8 09/24/2023   HCT 44.1 09/24/2023   MCV 88.2 09/24/2023   MCH 26.1 01/27/2023   RDW 13.1 09/24/2023   PLT 252.0 09/24/2023   Last metabolic panel Lab Results  Component Value Date   GLUCOSE 115 (H) 09/24/2023   NA 137 09/24/2023   K 5.0 09/24/2023   CL 101 09/24/2023   CO2 31 09/24/2023   BUN 22 09/24/2023   CREATININE 1.10 09/24/2023   GFR 71.89 09/24/2023   CALCIUM  9.7 09/24/2023   PROT 8.3 09/24/2023   ALBUMIN 4.7 09/24/2023   BILITOT 0.5 09/24/2023   ALKPHOS 77 09/24/2023   AST 25 09/24/2023   ALT 21 09/24/2023   ANIONGAP 5 01/27/2023   Last lipids Lab Results  Component Value Date   CHOL 183 02/26/2023   HDL 53.10 02/26/2023   LDLCALC 119 (H) 02/26/2023   LDLDIRECT 154.3 04/19/2009   TRIG 56.0 02/26/2023   CHOLHDL 3 02/26/2023   Last thyroid  functions Lab Results  Component Value Date   TSH 1.126 01/27/2023   Last vitamin B12 and Folate Lab Results  Component Value Date   VITAMINB12 2,434 (H) 01/27/2023      The 10-year ASCVD risk score (Arnett DK, et al., 2019) is: 7%    Assessment & Plan:   Problem List Items Addressed This Visit   None Visit Diagnoses       Dizziness    -  Primary   Relevant Orders   CBC with Differential/Platelet   CMP   Magnesium     Patient presents with some recent dizziness/lightheadedness along with 1 day of decreased memory.  Has had fairly extensive workup previously per neurology as above.  Etiology unclear.  Recent Alzheimer's disease workup unremarkable.  Blood pressure today seated repeat 115/64 and did dropped down to 102/60 with standing.  No true orthostasis drop but may have some relative hypotension.  Does work out very aggressively and may be slightly volume depleted.  We  discussed the following  -Check labs with CBC and CMP - Cautioned to hydrate well and pay specific attention electrolyte replacement with potassium and sodium with his vigorous workouts - Recommend close follow-up with neurology given recent day of altered memory.  He had no evidence for obvious seizure activity in previous evaluation but query whether he could have some occult seizure activity going on potentially.  No follow-ups on file.    Wolm Scarlet, MD

## 2023-10-13 ENCOUNTER — Encounter: Payer: Self-pay | Admitting: Neurology

## 2023-10-13 ENCOUNTER — Ambulatory Visit (INDEPENDENT_AMBULATORY_CARE_PROVIDER_SITE_OTHER): Admitting: Neurology

## 2023-10-13 VITALS — BP 138/76 | HR 76 | Ht 72.0 in | Wt 192.0 lb

## 2023-10-13 DIAGNOSIS — G3184 Mild cognitive impairment, so stated: Secondary | ICD-10-CM | POA: Diagnosis not present

## 2023-10-13 DIAGNOSIS — R404 Transient alteration of awareness: Secondary | ICD-10-CM

## 2023-10-13 MED ORDER — DONEPEZIL HCL 10 MG PO TABS: ORAL_TABLET | ORAL | 11 refills | Status: AC

## 2023-10-13 NOTE — Progress Notes (Signed)
 NEUROLOGY FOLLOW UP OFFICE NOTE  Paul Bird [id number] [birth year]  HISTORY OF PRESENT ILLNESS: I had the pleasure of seeing Paul Bird in follow-up in the neurology clinic on 10/13/2023. He is again accompanied by his wife Paul Bird who helps supplement the history today. The patient was last seen 4 months ago. He had an episode of decreased responsiveness on 01/26/23. His 24-hour EEG in 04/2023 was normal, typical events were not captured. He continues to have milder episodes which he feels is like anxiety, he feels it coming and building up in his chest going to his head with a pressure for 3- seconds. No speech or comprehension changes, no headache. One time it was in the office, another time at home where his wife noticed he was flushed and sweaty. His watch did not show any HR changes. Paul Bird reports he was not feeling well at PCP visit and was noted to have some orthostasis. They have been working on increased hydration and electrolytes since her works out regularly every morning. He feels better with this.  [name]'s biggest concern is his memory. He cannot recall prior trips. He continues to drive and although he always got lost, he uses his GPS without any issues. Paul Bird notes he repeats himself. She has always managed finances. He is only taking an aspirin regularly. He did not do well with Adderall. He continues to work and denies any difficulty at work, although states a lot of it is repetitive and more of a social component, although there is a lot of stress with it. He uses several memory techniques such as using a recorder and getting into a routine.   His lumbar puncture showed normal cell count, glucose, protein, cytology. We discussed [facility] send out results that showed an elevated p-Tau/Abeta2 ratio (0.072) consistent with the presence of pathological changes associated with Alzheimer's disease. Abeta42 was low at 563, Total-Tau was high at 384, P-Tau181 was high at 40.3.    History  on Initial Assessment 03/19/2023: This is a pleasant 63 year old ambidextrous left hand dominant man with a history of melanoma, ADHD, mild cognitive impairment, presenting for evaluation of syncope that occurred on 01/26/23. He was at home sitting at the table when he felt a swelling in his chest like an anxiety attack, he felt dizzy, then Paul Bird reports his head went back, right arm extended up and his eyes rolled a little bit. He was kind of mumbling, she asked if he was okay and speech was kind of mumbo jumbo. He had turned gray and was sweaty. He stood up and she could tell his balance was off, she does not think he knew who she was and she led him to the recliner to elevate his legs. He kept opening and closing both hands and his mouth, like he was trying to get feeling in them. He was pale, diaphoretic, then several minutes came out of it and said he was fine. He lay on the bed and was able to communicate but was still very pale and diaphoretic. No tongue bite or incontinence. He was refusing to go to the ER, he does not recall much of the ER visit. He does not recall having any headache or focal symptoms. In the ER, he had another episode while talking when suddenly the same look came over his face. He was pale and diaphoretic, she alerted the nurse who noted HR was 27. Notes indicate he had right-sided weakness and numbness that improved significantly in the ER with  right arm tingling and visual deficit in the right upper quadrant of right eye, NIHSS 3. They both do not recall that he had any right-sided symptoms. 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 EEG normal.   Since hospital discharge, he felt the same chest pressure when he was about to exercise a couple of weeks ago. He now has a watch monitoring his heart rate and there was no change in HR. No nausea, olfactory/gustatory hallucinations, focal  numbness/tingling/weakness. It lasted 1-2 minutes. Paul Bird denies any staring episodes. She notes that his memory since then has worsened, he was having more difficulties with directions to different places. Paul Bird notes memory issues for the past 3 years, however since the incident, he is more forgetful and takes a longer time to process to remember something, like his [place]ina is full. He gets frustrated when he cannot answer. It feels like things are in slow motion, he is more fatigued but continues to exercise regularly. He denies any headaches, diplopia, dysarthria/dysphagia, neck/back pain, bowel/bladder dysfunction, tremors, or anosmia. He may get dizzy standing up quickly at times. He gets 6-7 hours of sleep. He denies any palpitations. No recent infections, no new medications however he took Cialis  the day prior to the episode. He also took an over the counter product called HIMS around that time. He had a normal birth and early development.  There is no history of febrile convulsions, CNS infections such as meningitis/encephalitis, significant traumatic brain injury, neurosurgical procedures, or family history of seizures. His mother had dementia after she broke her hip.  Neuropsychological evaluation in 04/2022 indicated a diagnosis of Mild Neurocognitive Disorder. Pattern of performance suggestive of an isolated impairment surrounding complex attention and concentration, there was weakness across delayed retrieval aspects of both verbal and visual memory. Compared to 2021 evaluation, there was notable improvement across semantic fluency, only complex attention exhibited decline. Etiology unclear, overall improvement does not align with typical trajectory of AD, however it could not be completely ruled out. ADHD is currently untreated and may be contributing.   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   CSF 05/2023: elevated  p-Tau/Abeta2 ratio (0.072) consistent with the presence of pathological changes associated with Alzheimer's disease. Abeta42 was low at 563, Total-Tau was high at 384, P-Tau181 was high at 40.3.   PAST MEDICAL HISTORY: Past Medical History:  Diagnosis Date   Actinic keratosis 04/19/2009   Adenomatous polyp    ADHD (attention deficit hyperactivity disorder) 10/12/2013   Bradycardia    Cataract    Dysesthesia 09/08/2009   Erectile dysfunction 04/19/2009   Qualifier: Diagnosis of   By: Arbutus Noon       Melanoma of skin 11/19/2017   Clark's stage I, depth 0.2 mm   Mild cognitive impairment of uncertain or unknown etiology 04/19/2022   Syncope    Started 2 weeks ago 1/25 MD  aware per pt   Vitamin B12 deficiency     MEDICATIONS: Current Outpatient Medications on File Prior to Visit  Medication Sig Dispense Refill   aspirin 81 MG chewable tablet Chew by mouth daily. (Patient taking differently: Chew 162 mg by mouth daily.)     NON FORMULARY Take 1 tablet by mouth daily.     Omega-3 Fatty Acids (FISH OIL) 500 MG CAPS Take by mouth.     amphetamine -dextroamphetamine  (ADDERALL) 10 MG tablet  Take 1 tablet (10 mg total) by mouth 2 (two) times daily. 60 tablet 0   Cholecalciferol (VITAMIN D3) 25 MCG (1000 UT) CAPS Take by mouth.     No current facility-administered medications on file prior to visit.    ALLERGIES: No Known Allergies  FAMILY HISTORY: Family History  Problem Relation Age of Onset   Dementia Mother        73s   Memory loss Mother    Heart disease Father 47       ?viral myocarditis   Colon cancer Neg Hx    Colon polyps Neg Hx    Rectal cancer Neg Hx    Ulcerative colitis Neg Hx    Stomach cancer Neg Hx    Esophageal cancer Neg Hx     SOCIAL HISTORY: Social History   Socioeconomic History   Marital status: Married    Spouse name: Not on file   Number of children: Not on file   Years of education: 12   Highest education level: Some college, no degree   Occupational History   Occupation: Customer Relations  Tobacco Use   Smoking status: Never   Smokeless tobacco: Never  Vaping Use   Vaping status: Never Used  Substance and Sexual Activity   Alcohol use: Yes    Comment: once every 1-2 weeks   Drug use: No   Sexual activity: Not on file  Other Topics Concern   Not on file  Social History Narrative   Are you right handed or left handed? Left handed   Are you currently employed ? 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 No    Who lives with you? Wife    What type of home do you live in: 1 story or 2 story? 1 story with basement        Social Drivers of Health   Financial Resource Strain: Low Risk  (04/05/2023)   Overall Financial Resource Strain (CARDIA)    Difficulty of Paying Living Expenses: Not hard at all  Food Insecurity: No Food Insecurity (04/05/2023)   Hunger Vital Sign    Worried About Running Out of Food in the Last Year: Never true    Ran Out of Food in the Last Year: Never true  Transportation Needs: No Transportation Needs (04/05/2023)   PRAPARE - Administrator, Civil Service (Medical): No    Lack of Transportation (Non-Medical): No  Physical Activity: Sufficiently Active (04/05/2023)   Exercise Vital Sign    Days of Exercise per Week: 5 days    Minutes of Exercise per Session: 60 min  Stress: No Stress Concern Present (04/05/2023)   [employer] of Occupational Health - Occupational Stress Questionnaire    Feeling of Stress : Not at all  Social Connections: Socially Integrated (04/05/2023)   Social Connection and Isolation Panel    Frequency of Communication with Friends and Family: Three times a week    Frequency of Social Gatherings with Friends and Family: More than three times a week    Attends Religious Services: More than 4 times per year    Active Member of [employer] or Organizations: Yes    Attends Banker Meetings: More than 4 times per  year    Marital Status: Married  Catering manager Violence: Not At Risk (01/26/2023)   Humiliation, Afraid, Rape, and Kick questionnaire    Fear of Current or Ex-Partner: No  Emotionally Abused: No    Physically Abused: No    Sexually Abused: No     PHYSICAL EXAM: Vitals:   10/13/23 1249  BP: 138/76  Pulse: 76  SpO2: 97%   General: No acute distress Head:  Normocephalic/atraumatic Skin/Extremities: No rash, no edema Neurological Exam: alert and oriented to person, place, and time. No aphasia or dysarthria. Fund of knowledge is appropriate.  Recent and remote memory are intact, 3/3 delayed recall.  Attention and concentration are normal, 3/5 WORLD backwards (DRLOW).  Cranial nerves: Pupils equal, round. Extraocular movements intact with no nystagmus. Visual fields full.  No facial asymmetry.  Motor: Bulk and tone normal, muscle strength 5/5 throughout with no pronator drift.   Finger to nose testing intact.  Gait narrow-based and steady, able to tandem walk adequately.  Romberg negative.   IMPRESSION: This is a pleasant 63 yo ambidextrous left hand dominant man with a history of melanoma, ADHD, mild cognitive impairment, with an episode in 12/2022 with chest symptoms then transient alteration of awareness with right arm extension. MRI brain no acute changes, routine and 24-hour EEG normal. He had a transient episode in the ER with bradycardia to 27 bpm. No further significant bradycardia. No further similar intense episodes since 12/2022 but he has had a couple of smaller ones with no loss of awareness. Continue to monitor. His wife reports worsening memory, no difficulties with ADLs. His Neuropsychological evaluation in 04/2022 indicated MCI. We discussed the results of CSF biomarkers for AD showing elevated p-Tau/Abeta2 ratio consistent with the presence of pathological changes associated with Alzheimer's disease. We discussed what these changes indicate, and recommendation to start  Donepezil , including side effects and expectations from medication. Start Donepezil  10mg  1/2 tablet daily for 2 weeks, then increase to 1 tablet daily. We discussed newer infusion options for MCI, including side effects. They will look into them and let us  know if they would like a referral to [facility] to assess if he is a candidate. We discussed the importance of control of vascular risk factors, physical and brain stimulation exercises, MIND diet for overall brain health. Follow-up in 4 months, call for any changes.   Thank you for allowing me to participate in his care.  Please do not hesitate to call for any questions or concerns.    Darice Shivers, M.D.   CC: Dr. Micheal

## 2023-10-13 NOTE — Patient Instructions (Addendum)
 Good to see you.  Start Aricept  (Donepezil ) 10mg : take 1/2 tablet every night for 2 weeks, then increase to 1 tablet every night  2. If interested, there are some activities which have therapeutic value and can be useful in keeping you cognitively stimulated. You can try this website: https://www.[facility]row[facility].org/get-to-know-barrow/centers-programs/neurorehabilitation-center/neuro-rehab-apps-and-games/ which has options, categorized by level of difficulty.  3. Continue to monitor symptoms  4. Let me know if interested in available infusions at [facility]   5. Follow-up in 4 months, call for any changes    RECOMMENDATIONS FOR ALL PATIENTS WITH MEMORY PROBLEMS: 1. Continue to exercise (Recommend 30 minutes of walking everyday, or 3 hours every week) 2. Increase social interactions - continue going to [place] and enjoy social gatherings with friends and family 3. Eat healthy, avoid fried foods and eat more fruits and vegetables 4. Maintain adequate blood pressure, blood sugar, and blood cholesterol level. Reducing the risk of stroke and cardiovascular disease also helps promoting better memory. 5. Avoid stressful situations. Live a simple life and avoid aggravations. Organize your time and prepare for the next day in anticipation. 6. Sleep well, avoid any interruptions of sleep and avoid any distractions in the bedroom that may interfere with adequate sleep quality 7. Avoid sugar, avoid sweets as there is a strong link between excessive sugar intake, diabetes, and cognitive impairment We discussed the Mediterranean diet, which has been shown to help patients reduce the risk of progressive memory disorders and reduces cardiovascular risk. This includes eating fish, eat fruits and green leafy vegetables, nuts like almonds and hazelnuts, walnuts, and also use olive oil. Avoid fast foods and fried foods as much as possible. Avoid sweets and sugar as sugar use has been linked to worsening of memory  function.  There is always a concern of gradual progression of memory problems. If this is the case, then we may need to adjust level of care according to patient needs. Support, both to the patient and caregiver, should then be put into place.

## 2023-10-15 ENCOUNTER — Emergency Department (HOSPITAL_COMMUNITY)
Admission: EM | Admit: 2023-10-15 | Discharge: 2023-10-15 | Disposition: A | Discharge status: Home / Self Care | Attending: Emergency Medicine | Admitting: Emergency Medicine

## 2023-10-15 ENCOUNTER — Emergency Department (HOSPITAL_COMMUNITY)

## 2023-10-15 DIAGNOSIS — R569 Unspecified convulsions: Secondary | ICD-10-CM | POA: Insufficient documentation

## 2023-10-15 DIAGNOSIS — E860 Dehydration: Secondary | ICD-10-CM | POA: Diagnosis not present

## 2023-10-15 DIAGNOSIS — Z7982 Long term (current) use of aspirin: Secondary | ICD-10-CM | POA: Insufficient documentation

## 2023-10-15 LAB — CBC WITH DIFFERENTIAL/PLATELET
Abs Immature Granulocytes: 0.04 K/uL (ref 0.00–0.07)
Basophils Absolute: 0 K/uL (ref 0.0–0.1)
Basophils Relative: 0 %
Eosinophils Absolute: 0.1 K/uL (ref 0.0–0.5)
Eosinophils Relative: 2 %
HCT: 41.7 % (ref 39.0–52.0)
Hemoglobin: 14.1 g/dL (ref 13.0–17.0)
Immature Granulocytes: 1 %
Lymphocytes Relative: 18 %
Lymphs Abs: 1.2 K/uL (ref 0.7–4.0)
MCH: 29.8 pg (ref 26.0–34.0)
MCHC: 33.8 g/dL (ref 30.0–36.0)
MCV: 88.2 fL (ref 80.0–100.0)
Monocytes Absolute: 0.4 K/uL (ref 0.1–1.0)
Monocytes Relative: 6 %
Neutro Abs: 5 K/uL (ref 1.7–7.7)
Neutrophils Relative %: 73 %
Platelets: UNDETERMINED K/uL (ref 150–400)
RBC: 4.73 MIL/uL (ref 4.22–5.81)
RDW: 12.3 % (ref 11.5–15.5)
Smear Review: UNDETERMINED
WBC: 6.8 K/uL (ref 4.0–10.5)
nRBC: 0 % (ref 0.0–0.2)

## 2023-10-15 LAB — COMPREHENSIVE METABOLIC PANEL WITH GFR
ALT: 22 U/L (ref 0–44)
AST: 30 U/L (ref 15–41)
Albumin: 3.6 g/dL (ref 3.5–5.0)
Alkaline Phosphatase: 66 U/L (ref 38–126)
Anion gap: 11 (ref 5–15)
BUN: 17 mg/dL (ref 8–23)
CO2: 22 mmol/L (ref 22–32)
Calcium: 8.9 mg/dL (ref 8.9–10.3)
Chloride: 102 mmol/L (ref 98–111)
Creatinine, Ser: 1.17 mg/dL (ref 0.61–1.24)
GFR, Estimated: >60 mL/min (ref >60)
Glucose, Bld: 84 mg/dL (ref 70–99)
Potassium: 4.6 mmol/L (ref 3.5–5.1)
Sodium: 135 mmol/L (ref 135–145)
Total Bilirubin: 1.2 mg/dL (ref 0.0–1.2)
Total Protein: 7.2 g/dL (ref 6.5–8.1)

## 2023-10-15 LAB — CK: Total CK: 249 U/L (ref 49–397)

## 2023-10-15 LAB — RAPID URINE DRUG SCREEN, HOSP PERFORMED
Amphetamines: NOT DETECTED
Barbiturates: NOT DETECTED
Benzodiazepines: NOT DETECTED
Cocaine: NOT DETECTED
Opiates: NOT DETECTED
Tetrahydrocannabinol: NOT DETECTED

## 2023-10-15 LAB — URINALYSIS, W/ REFLEX TO CULTURE (INFECTION SUSPECTED)
Bilirubin Urine: NEGATIVE
Glucose, UA: NEGATIVE mg/dL
Hgb urine dipstick: NEGATIVE
Ketones, ur: 20 mg/dL — AB
Leukocytes,Ua: NEGATIVE
Nitrite: NEGATIVE
Protein, ur: NEGATIVE mg/dL
Specific Gravity, Urine: 1.014 (ref 1.005–1.030)
pH: 5 (ref 5.0–8.0)

## 2023-10-15 LAB — TROPONIN I (HIGH SENSITIVITY): Troponin I (High Sensitivity): 6 ng/L (ref <18)

## 2023-10-15 MED ORDER — SODIUM CHLORIDE 0.9 % IV BOLUS
1000.0000 mL | Freq: Once | INTRAVENOUS | Status: AC
  Administered 2023-10-15: 1000 mL via INTRAVENOUS

## 2023-10-15 MED ORDER — SODIUM CHLORIDE 0.9 % IV SOLN: INTRAVENOUS | Status: DC

## 2023-10-15 NOTE — ED Triage Notes (Signed)
 BIB [facility] for seizure at home. Witnessesd Syncopal episode. 2 mins convulsing. GCS was about 12 now at 15. 110 HR 146/80 112 CBG

## 2023-10-15 NOTE — ED Provider Notes (Signed)
 [place] EMERGENCY DEPARTMENT AT [facility] Provider Note   CSN: [id number] Arrival date & time: 10/15/23  1646     Patient presents with: No chief complaint on file.   Paul Bird is a 63 y.o. male.   63 year old male presents after witnessed syncopal event with some seizure activity.  Patient's wife who witnessed this states that patient was sitting at a bench at home in the bedroom when he stated he did not feel well.  She states that he collapsed and had tonic-clonic activity for approximately 2 minutes.  He was postictal for several minutes afterwards.  He was noted to have bit his tongue.  Patient has been in his baseline state of health.  Denies any recent history of headaches or any new medications.  No prior history of seizure disorder.  EMS was called and CBG was 112.  Initial GCS was 12 and now 15.       Prior to Admission medications   Medication Sig Start Date End Date Taking? Authorizing Provider  amphetamine -dextroamphetamine  (ADDERALL) 10 MG tablet Take 1 tablet (10 mg total) by mouth 2 (two) times daily. 05/15/23   Burchette, Wolm ORN, MD  aspirin 81 MG chewable tablet Chew by mouth daily. Patient taking differently: Chew 162 mg by mouth daily.    [provider]  Cholecalciferol (VITAMIN D3) 25 MCG (1000 UT) CAPS Take by mouth.    [provider]  donepezil  (ARICEPT ) 10 MG tablet Take 1/2 tablet every night for 2 weeks, then increase to 1 tablet every night 10/13/23   Aquino, Karen M, MD  NON FORMULARY Take 1 tablet by mouth daily.    [provider]  Omega-3 Fatty Acids (FISH OIL) 500 MG CAPS Take by mouth.    [provider]    Allergies: Patient has no known allergies.    Review of Systems  All other systems reviewed and are negative.   Updated Vital Signs There were no vitals taken for this visit.  Physical Exam Vitals and nursing note reviewed.  Constitutional:      General: He is not in acute  distress.    Appearance: Normal appearance. He is well-developed. He is not toxic-appearing.  HENT:     Head: Normocephalic and atraumatic.  Eyes:     General: Lids are normal.     Conjunctiva/sclera: Conjunctivae normal.     Pupils: Pupils are equal, round, and reactive to light.  Neck:     Thyroid : No thyroid  mass.     Trachea: No tracheal deviation.  Cardiovascular:     Rate and Rhythm: Normal rate and regular rhythm.     Heart sounds: Normal heart sounds. No murmur heard.    No gallop.  Pulmonary:     Effort: Pulmonary effort is normal. No respiratory distress.     Breath sounds: Normal breath sounds. No stridor. No decreased breath sounds, wheezing, rhonchi or rales.  Abdominal:     General: There is no distension.     Palpations: Abdomen is soft.     Tenderness: There is no abdominal tenderness. There is no rebound.  Musculoskeletal:        General: No tenderness. Normal range of motion.     Cervical back: Normal range of motion and neck supple.  Skin:    General: Skin is warm and dry.     Findings: No abrasion or rash.  Neurological:     General: No focal deficit present.     Mental  Status: He is alert and oriented to person, place, and time. Mental status is at baseline.     GCS: GCS eye subscore is 4. GCS verbal subscore is 5. GCS motor subscore is 6.     Cranial Nerves: No cranial nerve deficit.     Sensory: No sensory deficit.     Motor: Motor function is intact.  Psychiatric:        Attention and Perception: Attention normal.        Speech: Speech normal.        Behavior: Behavior normal.     (all labs ordered are listed, but only abnormal results are displayed) Labs Reviewed  COMPREHENSIVE METABOLIC PANEL WITH GFR  RAPID URINE DRUG SCREEN, HOSP PERFORMED  CBC WITH DIFFERENTIAL/PLATELET  CK  URINALYSIS, W/ REFLEX TO CULTURE (INFECTION SUSPECTED)    EKG: EKG Interpretation Date/Time:  Wednesday October 15 2023 18:19:45 EDT Ventricular Rate:  62 PR  Interval:  173 QRS Duration:  93 QT Interval:  426 QTC Calculation: 433 R Axis:   58  Text Interpretation: Sinus rhythm Confirmed by Dasie Faden ([id number]) on 10/15/2023 7:59:29 PM  Radiology: No results found.   Procedures   Medications Ordered in the ED  0.9 %  sodium chloride  infusion (has no administration in time range)  sodium chloride  0.9 % bolus 1,000 mL (has no administration in time range)                                    Medical Decision Making Amount and/or Complexity of Data Reviewed Labs: ordered. Radiology: ordered. ECG/medicine tests: ordered.  Risk Prescription drug management.   Patient is EKG shows normal sinus rhythm.  Labs are significant for mild dehydration.  Given IV fluids.  Head CT showed no acute findings here.  Patient has family at bedside who said that patient has baseline at this time.  Patient has a neurologist so he will make an appointment with to evaluate for seizure workup.  Patient given seizure driving precautions    Final diagnoses:  None    ED Discharge Orders     None          Dasie Faden, MD 10/15/23 2044

## 2023-10-15 NOTE — Discharge Instructions (Signed)
 Call your neurologist tomorrow to schedule a follow-up ointment.

## 2023-10-16 ENCOUNTER — Telehealth: Payer: Self-pay | Admitting: Neurology

## 2023-10-16 NOTE — Telephone Encounter (Signed)
 Pt's wife called in stating the pt had a seizure last night and he was unresponsive. They called an ambulance and he went to the hospital.

## 2023-10-16 NOTE — Telephone Encounter (Signed)
 Pls see if they can come in on Monday, thanks

## 2023-10-21 ENCOUNTER — Ambulatory Visit: Admitting: Neurology

## 2023-10-21 ENCOUNTER — Other Ambulatory Visit

## 2023-10-21 ENCOUNTER — Encounter: Payer: Self-pay | Admitting: Neurology

## 2023-10-21 VITALS — BP 130/83 | HR 64 | Ht 72.0 in | Wt 188.0 lb

## 2023-10-21 DIAGNOSIS — G40109 Localization-related (focal) (partial) symptomatic epilepsy and epileptic syndromes with simple partial seizures, not intractable, without status epilepticus: Secondary | ICD-10-CM

## 2023-10-21 MED ORDER — LEVETIRACETAM 500 MG PO TABS: 500.0000 mg | ORAL_TABLET | Freq: Two times a day (BID) | ORAL | 6 refills | Status: AC

## 2023-10-21 NOTE — Progress Notes (Signed)
 NEUROLOGY FOLLOW UP OFFICE NOTE  BRODRIC SCHAUER [id number] 08-21-1960  HISTORY OF PRESENT ILLNESS: I had the pleasure of seeing Julies Carmickle in follow-up in the neurology clinic on 10/21/2023.  The patient was last seen 63 years ago and presents for an urgent follow-up after a witnessed seizure on 10/15/23. He is again accompanied by his wife [name] who helps supplement the history today.  Records and images were personally reviewed where available.  [name] reports he had been 44 hours into fasting (no food, he was staying hydrated and taking electrolyte drinks, had a banana that morning). He came home from a walk and felt tired, took a nap. He got up and had 2-3 stressful calls then realized he forgot a meeting so he was rushing to get dressed when he started having the same anxiety/chest feeling and was sweating. [name] reports he was responding at this time, she had him lay on the bed and do breathing exercises, then noticed he had so much saliva like he was choking, then he turned on his right side in a fetal position with left arm flung back and had a convulsion lasting around 2 minutes. He was completely unconscious for 9 minutes until EMS arrived, SBP was 186, HR was elevated, glucose normal. He opened his eyes 10 minutes later and did not recognize anyone. In the ER, he was back to baseline. Bloodwork was normal. [name] reports Troponin I showed as elevated on her MyChart report, there is no reference range available for review. EKG showed sinus rhythm. Head CT no acute changes. He had a severe headache and left shoulder pain. He still has brain fog, fatigue, and muscle pain. No sleep deprivation or alcohol the night prior.   This episode was different from the event in 12/2022 where his head went back, right are extended, he was mumbling mumbo jumbo. He was pale and diaphoretic for a few minutes. He had a similar episode in the ER with HR of 27. 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 He had a normal routine EEG and normal 24-hour EEG in 04/2023. On his last visit, we had discussed his CSF AD biomarkers which has stressed him out, he has not started the Donepezil  yet. He denies any further headaches. He is able to lift his left arm above shoulder level but it still hurts. He denies any diplopia, dizziness, focal numbness/tingling.   History on Initial Assessment 03/19/2023: This is a pleasant 63 year old ambidextrous left hand dominant man with a history of melanoma, ADHD, mild cognitive impairment, presenting for evaluation of syncope that occurred on 01/26/23. He was at home sitting at the table when he felt a swelling in his chest like an anxiety attack, he felt dizzy, then [name] reports his head went back, right arm extended up and his eyes rolled a little bit. He was kind of mumbling, she asked if he was okay and speech was kind of mumbo jumbo. He had turned gray and was sweaty. He stood up and she could tell his balance was off, she does not think he knew who she was and she led him to the recliner to elevate his legs. He kept opening and closing both hands and his mouth, like he was trying to get feeling in them. He was pale, diaphoretic, then several minutes came out of it and said he was fine. He lay on the bed and was able to communicate  but was still very pale and diaphoretic. No tongue bite or incontinence. He was refusing to go to the ER, he does not recall much of the ER visit. He does not recall having any headache or focal symptoms. In the ER, he had another episode while talking when suddenly the same look came over his face. He was pale and diaphoretic, she alerted the nurse who noted HR was 27. Notes indicate he had right-sided weakness and numbness that improved significantly in the ER with right arm tingling and visual deficit in the right upper quadrant of right eye, NIHSS 3. They both do not recall that he had  any right-sided symptoms. 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 EEG normal.   Since hospital discharge, he felt the same chest pressure when he was about to exercise a couple of weeks ago. He now has a watch monitoring his heart rate and there was no change in HR. No nausea, olfactory/gustatory hallucinations, focal numbness/tingling/weakness. It lasted 1-2 minutes. [name] denies any staring episodes. She notes that his memory since then has worsened, he was having more difficulties with directions to different places. [name] notes memory issues for the past 3 years, however since the incident, he is more forgetful and takes a longer time to process to remember something, like his Larina is full. He gets frustrated when he cannot answer. It feels like things are in slow motion, he is more fatigued but continues to exercise regularly. He denies any headaches, diplopia, dysarthria/dysphagia, neck/back pain, bowel/bladder dysfunction, tremors, or anosmia. He may get dizzy standing up quickly at times. He gets 6-7 hours of sleep. He denies any palpitations. No recent infections, no new medications however he took Cialis  the day prior to the episode. He also took an over the counter product called HIMS around that time. He had a normal birth and early development.  There is no history of febrile convulsions, CNS infections such as meningitis/encephalitis, significant traumatic brain injury, neurosurgical procedures, or family history of seizures. His mother had dementia after she broke her hip.  Neuropsychological evaluation in 04/2022 indicated a diagnosis of Mild Neurocognitive Disorder. Pattern of performance suggestive of an isolated impairment surrounding complex attention and concentration, there was weakness across delayed retrieval aspects of both verbal and visual memory. Compared to 2021 evaluation, there was notable  improvement across semantic fluency, only complex attention exhibited decline. Etiology unclear, overall improvement does not align with typical trajectory of AD, however it could not be completely ruled out. ADHD is currently untreated and may be contributing.   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   CSF 05/2023: elevated p-Tau/Abeta2 ratio (0.072) consistent with the presence of pathological changes associated with Alzheimer's disease. Abeta42 was low at 563, Total-Tau was high at 384, P-Tau181 was high at 40.3.   PAST MEDICAL HISTORY: Past Medical History:  Diagnosis Date   Actinic keratosis 04/19/2009   Adenomatous polyp    ADHD (attention deficit hyperactivity disorder) 10/12/2013   Bradycardia    Cataract    Dysesthesia 09/08/2009   Erectile dysfunction 04/19/2009   Qualifier: Diagnosis of   By: Arbutus Noon       Melanoma of skin 11/19/2017   Clark's stage I, depth 0.2 mm   Mild cognitive impairment of uncertain or unknown etiology 04/19/2022   Syncope    Started 2 weeks  ago 1/25 MD  aware per pt   Vitamin B12 deficiency     MEDICATIONS: Current Outpatient Medications on File Prior to Visit  Medication Sig Dispense Refill   aspirin 81 MG chewable tablet Chew by mouth daily.     NON FORMULARY Take 1 tablet by mouth daily.     Omega-3 Fatty Acids (FISH OIL) 500 MG CAPS Take by mouth.     amphetamine -dextroamphetamine  (ADDERALL) 10 MG tablet Take 1 tablet (10 mg total) by mouth 2 (two) times daily. 60 tablet 0   Cholecalciferol (VITAMIN D3) 25 MCG (1000 UT) CAPS Take by mouth.     donepezil  (ARICEPT ) 10 MG tablet Take 1/2 tablet every night for 2 weeks, then increase to 1 tablet every night (Patient not taking: Reported on 10/21/2023) 30 tablet 11   No current facility-administered medications on file prior to visit.    ALLERGIES: No Known Allergies  FAMILY HISTORY: Family History   Problem Relation Age of Onset   Dementia Mother        46s   Memory loss Mother    Heart disease Father 79       ?viral myocarditis   Colon cancer Neg Hx    Colon polyps Neg Hx    Rectal cancer Neg Hx    Ulcerative colitis Neg Hx    Stomach cancer Neg Hx    Esophageal cancer Neg Hx     SOCIAL HISTORY: Social History   Socioeconomic History   Marital status: Married    Spouse name: Not on file   Number of children: Not on file   Years of education: 12   Highest education level: Some college, no degree  Occupational History   Occupation: Customer Relations  Tobacco Use   Smoking status: Never   Smokeless tobacco: Never  Vaping Use   Vaping status: Never Used  Substance and Sexual Activity   Alcohol use: Yes    Comment: once every 1-2 weeks   Drug use: No   Sexual activity: Not on file  Other Topics Concern   Not on file  Social History Narrative   Are you right handed or left handed? Left handed   Are you currently employed ? 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 No    Who lives with you? Wife    What type of home do you live in: 1 story or 2 story? 1 story with basement        Social Drivers of Health   Financial Resource Strain: Low Risk  (04/05/2023)   Overall Financial Resource Strain (CARDIA)    Difficulty of Paying Living Expenses: Not hard at all  Food Insecurity: No Food Insecurity (04/05/2023)   Hunger Vital Sign    Worried About Running Out of Food in the Last Year: Never true    Ran Out of Food in the Last Year: Never true  Transportation Needs: No Transportation Needs (04/05/2023)   PRAPARE - Administrator, Civil Service (Medical): No    Lack of Transportation (Non-Medical): No  Physical Activity: Sufficiently Active (04/05/2023)   Exercise Vital Sign    Days of Exercise per Week: 5 days    Minutes of Exercise per Session: 60 min  Stress: No Stress Concern Present (04/05/2023)   [employer] of Occupational [employer] - Occupational Stress Questionnaire    Feeling of Stress : Not at all  Social Connections: Socially Integrated (  04/05/2023)   Social Connection and Isolation Panel    Frequency of Communication with Friends and Family: Three times a week    Frequency of Social Gatherings with Friends and Family: More than three times a week    Attends Religious Services: More than 4 times per year    Active Member of [employer] or Organizations: Yes    Attends Engineer, structural: More than 4 times per year    Marital Status: Married  Catering manager Violence: Not At Risk (01/26/2023)   Humiliation, Afraid, Rape, and Kick questionnaire    Fear of Current or Ex-Partner: No    Emotionally Abused: No    Physically Abused: No    Sexually Abused: No     PHYSICAL EXAM: Vitals:   10/21/23 0904  BP: 130/83  Pulse: 64  SpO2: 99%   General: No acute distress Head:  Normocephalic/atraumatic Skin/Extremities: No rash, no edema Neurological Exam: alert and awake. No aphasia or dysarthria. Fund of knowledge is appropriate.  Attention and concentration are normal.   Cranial nerves: Pupils equal, round. Extraocular movements intact with no nystagmus. Visual fields full.  No facial asymmetry.  Motor: Bulk and tone normal, muscle strength 5/5 throughout with no pronator drift (pain on left shoulder abduction).   Finger to nose testing intact.  Gait narrow-based and steady, able to tandem walk adequately.  Romberg negative.   IMPRESSION: This is a pleasant 63 yo ambidextrous left hand dominant man with a history of melanoma, ADHD, mild cognitive impairment, with an episode in 12/2022 with chest symptoms then transient alteration of awareness with right arm extension. MRI brain no acute changes, routine and 24-hour EEG normal. He had a transient episode in the ER wit47h bradycardia to 27 bpm. He presents for an urgent visit after a witnessed convulsion on 10/15/23. This was preceded  by the same anxiety/chest feeling he has been having. I discussed the diagnosis of epilepsy, with the milder anxiety spells likely focal seizures. MRI brain with and without contrast and a 72-hour EEG will be ordered for further characterization. We discussed starting Levetiracetam  500mg  BID, side effects discussed. We discussed to avoid fasting at this point. Hold off on Donepezil . We had an extensive discussion about the diagnosis, management, and driving restrictions in patients with seizures. He is aware of [place] driving laws to stop driving until 6 months seizure-free. We discussed stress reduction. There is concern Troponin I was elevated in the ER, repeat level will be ordered today. Follow-up in 6 weeks, call for any changes.   Thank you for allowing me to participate in his care.  Please do not hesitate to call for any questions or concerns.  The duration of this appointment visit was 47 minutes of face-to-face time with the patient.  Greater than 50% of this time was spent in counseling, explanation of diagnosis, planning of further management, and coordination of care.   Darice Shivers, M.D.   CC: Dr. Micheal

## 2023-10-21 NOTE — Patient Instructions (Addendum)
 Have bloodwork done for Troponin I (high sensitivity)  2. Schedule 3-day home EEG  3. Schedule MRI brain with and without contrast  4. Start Levetiracetam  (Keppra ) 500mg : take 1 tablet twice a day  5. Follow-up in 6 weeks, call for any changes   Seizure Precautions: 1. If medication has been prescribed for you to prevent seizures, take it exactly as directed.  Do not stop taking the medicine without talking to your doctor first, even if you have not had a seizure in a long time.   2. Avoid activities in which a seizure would cause danger to yourself or to others.  Don't operate dangerous machinery, swim alone, or climb in high or dangerous places, such as on ladders, roofs, or girders.  Do not drive unless your doctor says you may.  3. If you have any warning that you may have a seizure, lay down in a safe place where you can't hurt yourself.    4.  No driving for 6 months from last seizure, as per [place]  state law.   Please refer to the following link on the Epilepsy [employer] of [employer]'s website for more information: http://www.epilepsyfoundation.org/answerplace/Social/driving/drivingu.cfm   5.  Maintain good sleep hygiene. Avoid alcohol.  6.  Contact your doctor if you have any problems that may be related to the medicine you are taking.  7.  Call 911 and bring the patient back to the ED if:        A.  The seizure lasts longer than 5 minutes.       B.  The patient doesn't awaken shortly after the seizure  C.  The patient has new problems such as difficulty seeing, speaking or moving  D.  The patient was injured during the seizure  E.  The patient has a temperature over 102 F (39C)  F.  The patient vomited and now is having trouble breathing  [facility] [phone]

## 2023-10-23 LAB — TROPONIN T, HIGH SENSITIVITY (HS-TNT): Troponin T (Highly Sensitive): 7 ng/L (ref <23)

## 2023-10-24 ENCOUNTER — Ambulatory Visit: Payer: Self-pay | Admitting: Neurology

## 2023-10-27 ENCOUNTER — Encounter: Payer: Self-pay | Admitting: Neurology

## 2023-10-28 ENCOUNTER — Encounter: Payer: Self-pay | Admitting: Neurology

## 2023-10-30 ENCOUNTER — Encounter: Payer: Self-pay | Admitting: Neurology

## 2023-10-31 ENCOUNTER — Encounter: Payer: Self-pay | Admitting: Neurology

## 2023-10-31 ENCOUNTER — Ambulatory Visit: Payer: Self-pay

## 2023-10-31 ENCOUNTER — Telehealth: Payer: Self-pay | Admitting: Neurology

## 2023-10-31 DIAGNOSIS — Z0279 Encounter for issue of other medical certificate: Secondary | ICD-10-CM

## 2023-10-31 NOTE — Telephone Encounter (Signed)
 Pt's wife came in and dropped off FMLA forms. She would like to pick them up once completed. She did pay the $25 form fee.   Forms are in Dr. Ellena box

## 2023-10-31 NOTE — Telephone Encounter (Signed)
Results were released in my chart

## 2023-10-31 NOTE — Telephone Encounter (Signed)
 Pt contacted can pick up paperwork Monday morning.

## 2023-11-03 NOTE — Telephone Encounter (Signed)
 Please advise about test results for Alzheimer.

## 2023-11-03 NOTE — Telephone Encounter (Signed)
 FMLA--been pick up by pt wife.

## 2023-11-04 ENCOUNTER — Inpatient Hospital Stay
Admission: RE | Admit: 2023-11-04 | Discharge: 2023-11-04 | Discharge status: Home / Self Care | Source: Ambulatory Visit | Attending: Neurology

## 2023-11-04 DIAGNOSIS — G40109 Localization-related (focal) (partial) symptomatic epilepsy and epileptic syndromes with simple partial seizures, not intractable, without status epilepticus: Secondary | ICD-10-CM

## 2023-11-04 MED ORDER — GADOPICLENOL 0.5 MMOL/ML IV SOLN
9.0000 mL | Freq: Once | INTRAVENOUS | Status: AC | PRN
  Administered 2023-11-04: 9 mL via INTRAVENOUS

## 2023-12-02 ENCOUNTER — Other Ambulatory Visit

## 2023-12-03 ENCOUNTER — Telehealth: Payer: Self-pay | Admitting: *Deleted

## 2023-12-03 NOTE — Telephone Encounter (Signed)
 Pre-operative Assesment form is completed/singed/faxed [phone]

## 2023-12-08 ENCOUNTER — Encounter: Payer: Self-pay | Admitting: Neurology

## 2023-12-10 ENCOUNTER — Ambulatory Visit: Admitting: Neurology

## 2023-12-26 ENCOUNTER — Other Ambulatory Visit

## 2024-02-16 ENCOUNTER — Ambulatory Visit: Admitting: Neurology
